# Patient Record
Sex: Female | Born: 1990 | Race: Black or African American | Hispanic: No | Smoking: Former smoker
Health system: Southern US, Community
[De-identification: ages and names within clinical notes are randomized; demographics above are authoritative.]

## PROBLEM LIST (undated history)

## (undated) ENCOUNTER — Inpatient Hospital Stay (HOSPITAL_COMMUNITY): Payer: Self-pay

## (undated) ENCOUNTER — Inpatient Hospital Stay: Payer: Self-pay

## (undated) DIAGNOSIS — O24419 Gestational diabetes mellitus in pregnancy, unspecified control: Secondary | ICD-10-CM

## (undated) DIAGNOSIS — O1490 Unspecified pre-eclampsia, unspecified trimester: Secondary | ICD-10-CM

## (undated) DIAGNOSIS — F32A Depression, unspecified: Secondary | ICD-10-CM

## (undated) DIAGNOSIS — F329 Major depressive disorder, single episode, unspecified: Secondary | ICD-10-CM

## (undated) DIAGNOSIS — J45909 Unspecified asthma, uncomplicated: Secondary | ICD-10-CM

## (undated) DIAGNOSIS — O139 Gestational [pregnancy-induced] hypertension without significant proteinuria, unspecified trimester: Secondary | ICD-10-CM

## (undated) DIAGNOSIS — Z87440 Personal history of urinary (tract) infections: Secondary | ICD-10-CM

## (undated) DIAGNOSIS — D649 Anemia, unspecified: Secondary | ICD-10-CM

## (undated) HISTORY — DX: Depression, unspecified: F32.A

## (undated) HISTORY — DX: Gestational diabetes mellitus in pregnancy, unspecified control: O24.419

## (undated) HISTORY — DX: Anemia, unspecified: D64.9

## (undated) HISTORY — DX: Unspecified pre-eclampsia, unspecified trimester: O14.90

## (undated) HISTORY — PX: NO PAST SURGERIES: SHX2092

## (undated) HISTORY — DX: Gestational (pregnancy-induced) hypertension without significant proteinuria, unspecified trimester: O13.9

## (undated) HISTORY — DX: Personal history of urinary (tract) infections: Z87.440

---
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
## 2009-05-21 ENCOUNTER — Inpatient Hospital Stay (HOSPITAL_COMMUNITY): Admission: EM | Admit: 2009-05-21 | Discharge: 2009-05-24 | Payer: Self-pay | Admitting: Emergency Medicine

## 2009-05-23 ENCOUNTER — Ambulatory Visit: Payer: Self-pay | Admitting: Internal Medicine

## 2009-06-27 ENCOUNTER — Emergency Department (HOSPITAL_COMMUNITY): Admission: EM | Admit: 2009-06-27 | Discharge: 2009-06-27 | Payer: Self-pay | Admitting: Emergency Medicine

## 2010-03-26 ENCOUNTER — Emergency Department (HOSPITAL_COMMUNITY): Admission: EM | Admit: 2010-03-26 | Discharge: 2010-03-26 | Payer: Self-pay | Admitting: Emergency Medicine

## 2010-12-06 LAB — POCT I-STAT, CHEM 8
Chloride: 111 mEq/L (ref 96–112)
Glucose, Bld: 91 mg/dL (ref 70–99)
Hemoglobin: 13.9 g/dL (ref 12.0–15.0)
Potassium: 3.5 mEq/L (ref 3.5–5.1)
Sodium: 143 mEq/L (ref 135–145)
TCO2: 20 mmol/L (ref 0–100)

## 2010-12-07 LAB — POCT I-STAT, CHEM 8
Calcium, Ion: 1.16 mmol/L (ref 1.12–1.32)
HCT: 43 % (ref 36.0–46.0)
Sodium: 141 mEq/L (ref 135–145)

## 2010-12-07 LAB — URINALYSIS, ROUTINE W REFLEX MICROSCOPIC
Bilirubin Urine: NEGATIVE
Glucose, UA: NEGATIVE mg/dL
Nitrite: NEGATIVE
Urobilinogen, UA: 1 mg/dL (ref 0.0–1.0)

## 2010-12-07 LAB — DIFFERENTIAL
Basophils Absolute: 0.2 10*3/uL — ABNORMAL HIGH (ref 0.0–0.1)
Eosinophils Absolute: 0.3 10*3/uL (ref 0.0–0.7)
Eosinophils Relative: 3 % (ref 0–5)
Lymphocytes Relative: 32 % (ref 12–46)
Lymphocytes Relative: 36 % (ref 12–46)
Monocytes Absolute: 0.4 10*3/uL (ref 0.1–1.0)
Monocytes Absolute: 0.5 10*3/uL (ref 0.1–1.0)
Monocytes Relative: 6 % (ref 3–12)
Neutro Abs: 4.3 10*3/uL (ref 1.7–7.7)
Neutrophils Relative %: 53 % (ref 43–77)
Neutrophils Relative %: 58 % (ref 43–77)

## 2010-12-07 LAB — BASIC METABOLIC PANEL
BUN: 12 mg/dL (ref 6–23)
Creatinine, Ser: 0.78 mg/dL (ref 0.4–1.2)
Creatinine, Ser: 0.84 mg/dL (ref 0.4–1.2)
GFR calc non Af Amer: >60 mL/min (ref >60)
Glucose, Bld: 97 mg/dL (ref 70–99)
Potassium: 3.9 mEq/L (ref 3.5–5.1)
Sodium: 139 mEq/L (ref 135–145)

## 2010-12-07 LAB — CBC
HCT: 35.3 % — ABNORMAL LOW (ref 36.0–46.0)
MCHC: 33.8 g/dL (ref 30.0–36.0)
MCHC: 34.1 g/dL (ref 30.0–36.0)
MCV: 85.4 fL (ref 78.0–100.0)
Platelets: 261 10*3/uL (ref 150–400)
RBC: 4.13 MIL/uL (ref 3.87–5.11)
RDW: 12.5 % (ref 11.5–15.5)
RDW: 12.6 % (ref 11.5–15.5)
WBC: 8.2 10*3/uL (ref 4.0–10.5)

## 2010-12-07 LAB — GONOCOCCUS DNA, PCR: GC Probe Amp, Genital: NEGATIVE

## 2010-12-07 LAB — GRAM STAIN: Gram Stain: NONE SEEN

## 2010-12-07 LAB — WOUND CULTURE: Culture: NO GROWTH

## 2010-12-07 LAB — URINE MICROSCOPIC-ADD ON

## 2010-12-07 LAB — CULTURE, BLOOD (ROUTINE X 2)

## 2011-06-10 LAB — POCT URINALYSIS DIP (DEVICE)
Operator id: 148111
Protein, ur: NEGATIVE

## 2011-06-10 LAB — RPR: RPR Ser Ql: NONREACTIVE

## 2011-06-10 LAB — CBC
MCHC: 34
Platelets: 307
RBC: 4.66

## 2011-06-11 LAB — POCT URINALYSIS DIP (DEVICE)
Bilirubin Urine: NEGATIVE
Glucose, UA: NEGATIVE
Glucose, UA: NEGATIVE
Ketones, ur: NEGATIVE
Nitrite: NEGATIVE
Nitrite: NEGATIVE
Operator id: 159681
Protein, ur: NEGATIVE
Specific Gravity, Urine: 1.025
Urobilinogen, UA: 0.2
Urobilinogen, UA: 0.2
pH: 6

## 2011-06-12 LAB — URINE MICROSCOPIC-ADD ON

## 2011-06-12 LAB — POCT URINALYSIS DIP (DEVICE)
Bilirubin Urine: NEGATIVE
Glucose, UA: NEGATIVE
Ketones, ur: NEGATIVE
Nitrite: NEGATIVE
Operator id: 134861
Specific Gravity, Urine: >=1.03
Urobilinogen, UA: 0.2
Urobilinogen, UA: 1
pH: 6

## 2011-06-12 LAB — CBC
HCT: 33.8 — ABNORMAL LOW
Hemoglobin: 11.5 — ABNORMAL LOW
MCV: 84.7
RDW: 13

## 2011-06-12 LAB — URINALYSIS, ROUTINE W REFLEX MICROSCOPIC
Bilirubin Urine: NEGATIVE
Ketones, ur: NEGATIVE
Nitrite: NEGATIVE
Protein, ur: NEGATIVE
Specific Gravity, Urine: 1.025
Specific Gravity, Urine: 1.025
Urobilinogen, UA: 0.2
Urobilinogen, UA: 0.2

## 2011-06-12 LAB — URINE CULTURE
Colony Count: NO GROWTH
Culture: NO GROWTH

## 2011-06-13 LAB — POCT URINALYSIS DIP (DEVICE)
Nitrite: NEGATIVE
Protein, ur: NEGATIVE
Urobilinogen, UA: 1
pH: 6.5

## 2011-06-14 LAB — GC/CHLAMYDIA PROBE AMP, GENITAL: Chlamydia, DNA Probe: NEGATIVE

## 2011-06-14 LAB — WET PREP, GENITAL: Clue Cells Wet Prep HPF POC: NONE SEEN

## 2011-06-14 LAB — DIFFERENTIAL
Basophils Absolute: 0
Eosinophils Relative: 0
Lymphocytes Relative: 11 — ABNORMAL LOW

## 2011-06-14 LAB — CBC
HCT: 33.7 — ABNORMAL LOW
Platelets: 283
RDW: 13.5

## 2011-06-14 LAB — URINALYSIS, ROUTINE W REFLEX MICROSCOPIC
Bilirubin Urine: NEGATIVE
Hgb urine dipstick: NEGATIVE
Ketones, ur: NEGATIVE
Protein, ur: NEGATIVE
Urobilinogen, UA: 0.2

## 2011-06-14 LAB — TYPE AND SCREEN
ABO/RH(D): A POS
Antibody Screen: NEGATIVE

## 2011-06-14 LAB — RUBELLA SCREEN: Rubella: 162.3 — ABNORMAL HIGH

## 2011-06-14 LAB — HEPATITIS B SURFACE ANTIGEN: Hepatitis B Surface Ag: NEGATIVE

## 2012-02-13 ENCOUNTER — Emergency Department (HOSPITAL_COMMUNITY): Payer: Self-pay

## 2012-02-13 ENCOUNTER — Emergency Department (HOSPITAL_COMMUNITY)
Admission: EM | Admit: 2012-02-13 | Discharge: 2012-02-13 | Disposition: A | Discharge status: Home / Self Care | Payer: Self-pay | Attending: Emergency Medicine | Admitting: Emergency Medicine

## 2012-02-13 ENCOUNTER — Encounter (HOSPITAL_COMMUNITY): Payer: Self-pay | Admitting: Emergency Medicine

## 2012-02-13 DIAGNOSIS — W010XXA Fall on same level from slipping, tripping and stumbling without subsequent striking against object, initial encounter: Secondary | ICD-10-CM | POA: Insufficient documentation

## 2012-02-13 DIAGNOSIS — F17200 Nicotine dependence, unspecified, uncomplicated: Secondary | ICD-10-CM | POA: Insufficient documentation

## 2012-02-13 DIAGNOSIS — Y9302 Activity, running: Secondary | ICD-10-CM | POA: Insufficient documentation

## 2012-02-13 DIAGNOSIS — S93409A Sprain of unspecified ligament of unspecified ankle, initial encounter: Secondary | ICD-10-CM | POA: Insufficient documentation

## 2012-02-13 MED ORDER — OXYCODONE-ACETAMINOPHEN 5-325 MG PO TABS
2.0000 | ORAL_TABLET | Freq: Once | ORAL | Status: AC
  Administered 2012-02-13: 2 via ORAL
  Filled 2012-02-13: qty 2

## 2012-02-13 NOTE — ED Notes (Signed)
Patient states that she fell up the stars and twisted her left ankle. The patient states that it immediately swollen. The patient denies and further complaints. Patient walks with limp

## 2012-02-13 NOTE — Discharge Instructions (Signed)
Be sure to read and understand instructions below prior to leaving the hospital. If your symptoms persist without any improvement in 1 week it is reccommended that you follow up with orthopedics listed above. Use your pain medication as prescribed and do not operate heavy machinery while on pain medication. Note that your pain medication contains acetaminophen (Tylenol) & its is not reccommended that you use additional acetaminophen (Tylenol) while taking this medication.  Ankle Sprain  An ankle sprain is an injury to the ligaments that hold the ankle joint together. Your X-ray today showed no evidence of fracture, however keep all follow-up appointments with an orthopedic specialist to have follow-up X-rays, because as we discussed fractures may not appear until 3 days after the acute injury.    TREATMENT  Rest, ice, elevation, and compression are the basic modes of treatment.    HOME CARE INSTRUCTIONS  Apply ice to the sore area for 15 to 20 minutes, 3 to 4 times per day. Do this while you are awake for the first 2 days, or as directed. This can be stopped when the swelling goes away. Put the ice in a plastic bag and place a towel between the bag of ice and your skin.  Keep your leg elevated when possible to lessen swelling.  If your caregiver recommends crutches, use them as instructed for 1 week. Then, you may walk on your ankle weight bearing as tolerated.  You may take off your ankle stabilizer at night and to take a shower or bath. Wiggle your toes in the splint several times per day if you are able.  Do not drive a vehicle on pain medication. ACTIVITY:            - Weight bearing as tolerated            - Exercises should be limited to pain free range of motion            - Can start mobilization by tracing the alphabet with your foot in the air.       SEEK MEDICAL CARE IF:  You have an increase in bruising, swelling, or pain.  Your toes feel cold.  Pain relief is not achieved with  medications.  EMERGENCY:: Your toes are numb or blue or you have severe pain.  MAKE SURE YOU:  Understand these instructions.  Will watch your condition.  Will get help right away if you are not doing well or get worse   COLD THERAPY DIRECTIONS:  Ice or gel packs can be used to reduce both pain and swelling. Ice is the most helpful within the first 24 to 48 hours after an injury or flareup from overusing a muscle or joint.  Ice is effective, has very few side effects, and is safe for most people to use.   If you expose your skin to cold temperatures for too long or without the proper protection, you can damage your skin or nerves. Watch for signs of skin damage due to cold.   HOME CARE INSTRUCTIONS  Follow these tips to use ice and cold packs safely.  Place a dry or damp towel between the ice and skin. A damp towel will cool the skin more quickly, so you may need to shorten the time that the ice is used.  For a more rapid response, add gentle compression to the ice.  Ice for no more than 10 to 20 minutes at a time. The bonier the area you are icing, the less   time it will take to get the benefits of ice.  Check your skin after 5 minutes to make sure there are no signs of a poor response to cold or skin damage.  Rest 20 minutes or more in between uses.  Once your skin is numb, you can end your treatment. You can test numbness by very lightly touching your skin. The touch should be so light that you do not see the skin dimple from the pressure of your fingertip. When using ice, most people will feel these normal sensations in this order: cold, burning, aching, and numbness.  Do not use ice on someone who cannot communicate their responses to pain, such as small children or people with dementia.   HOW TO MAKE AN ICE PACK  To make an ice pack, do one of the following:  Place crushed ice or a bag of frozen vegetables in a sealable plastic bag. Squeeze out the excess air. Place this bag inside  another plastic bag. Slide the bag into a pillowcase or place a damp towel between your skin and the bag.  Mix 3 parts water with 1 part rubbing alcohol. Freeze the mixture in a sealable plastic bag. When you remove the mixture from the freezer, it will be slushy. Squeeze out the excess air. Place this bag inside another plastic bag. Slide the bag into a pillowcase or place a damp towel between your sk 

## 2012-02-13 NOTE — ED Provider Notes (Signed)
History     CSN: [id number]  Arrival date & time 02/13/12  1753   First MD Initiated Contact with Patient 02/13/12 1934      Chief Complaint  Patient presents with  . Ankle Pain    (Consider location/radiation/quality/duration/timing/severity/associated sxs/prior treatment) HPI Comments: Patient reports that approximately 2 hours prior to arrival she was running while it was raining and slipped on mud.  She then twisted her left ankle and fell to the ground.  She is currently having pain of the medial and the lateral portion of the ankle.  No prior injury. No numbness or tingling.  She is having some swelling over the medial malleolus.  She reports significant pain with ambulation.  She has tried taking Ibuprofen for the pain, but does not feel that it helped.  The history is provided by the patient.    History reviewed. No pertinent past medical history.  History reviewed. No pertinent past surgical history.  No family history on file.  History  Substance Use Topics  . Smoking status: Current Everyday Smoker -- 1.0 packs/day    Types: Cigarettes  . Smokeless tobacco: Not on file  . Alcohol Use: Yes     occasionally    OB History    Grav Para Term Preterm Abortions TAB SAB Ect Mult Living                  Review of Systems  Constitutional: Negative for fever and chills.  Gastrointestinal: Negative for nausea and vomiting.  Musculoskeletal: Positive for joint swelling.       Pain with ambulation  Skin: Negative for color change and wound.  Neurological: Negative for numbness.    Allergies  Review of patient's allergies indicates no known allergies.  Home Medications   Current Outpatient Rx  Name Route Sig Dispense Refill  . ACETAMINOPHEN 500 MG PO TABS Oral Take 1,000 mg by mouth once.    . IBUPROFEN 200 MG PO TABS Oral Take 600 mg by mouth once.    Marland Kitchen MEDROXYPROGESTERONE ACETATE 150 MG/ML IM SUSP Intramuscular Inject 150 mg into the muscle every 3 (three)  months.      BP 118/59  Pulse 80  Temp 98.2 F (36.8 C) (Oral)  Resp 20  SpO2 100%  LMP 02/06/2012  Physical Exam  Nursing note and vitals reviewed. Constitutional: She appears well-developed and well-nourished. No distress.  HENT:  Head: Normocephalic and atraumatic.  Mouth/Throat: Oropharynx is clear and moist.  Neck: Normal range of motion. Neck supple.  Cardiovascular: Normal rate, regular rhythm, normal heart sounds and intact distal pulses.   Pulses:      Dorsalis pedis pulses are 2+ on the right side, and 2+ on the left side.  Pulmonary/Chest: Effort normal and breath sounds normal.  Musculoskeletal:       Left ankle: She exhibits decreased range of motion and swelling. She exhibits no ecchymosis, no deformity, no laceration and normal pulse. tenderness. Lateral malleolus and medial malleolus tenderness found. Achilles tendon exhibits no pain.       Pain with ambulation and bearing weight  Neurological: She is alert. No sensory deficit.  Skin: Skin is warm and dry. She is not diaphoretic. No erythema.  Psychiatric: She has a normal mood and affect.    ED Course  Procedures (including critical care time)  Labs Reviewed - No data to display Dg Ankle Complete Left  02/13/2012  *RADIOLOGY REPORT*  Clinical Data: Fall, pain  LEFT ANKLE COMPLETE - 3+ VIEW  Comparison: None.  Findings: Normal alignment without effusion.  No definite displaced fracture.  Intact malleoli, talus and calcaneus.  IMPRESSION: No acute osseous finding.  Mild soft tissue swelling.  Original Report Authenticated By: Judie Petit. Ruel Favors, M.D.     No diagnosis found.    MDM  Negative xray.  Neurovascularly intact.  Patient given ASO and crutches.          Pascal Lux Crabtree, PA-C 02/14/12 0222

## 2012-02-14 NOTE — ED Provider Notes (Signed)
Medical screening examination/treatment/procedure(s) were performed by non-physician practitioner and as supervising physician I was immediately available for consultation/collaboration.  Raeford Razor, MD 02/14/12 1245

## 2012-10-19 ENCOUNTER — Ambulatory Visit (INDEPENDENT_AMBULATORY_CARE_PROVIDER_SITE_OTHER): Payer: Self-pay

## 2012-10-19 DIAGNOSIS — Z3202 Encounter for pregnancy test, result negative: Secondary | ICD-10-CM

## 2012-10-19 DIAGNOSIS — Z3200 Encounter for pregnancy test, result unknown: Secondary | ICD-10-CM

## 2013-08-18 ENCOUNTER — Encounter (HOSPITAL_COMMUNITY): Payer: Self-pay | Admitting: Emergency Medicine

## 2013-08-18 ENCOUNTER — Emergency Department (HOSPITAL_COMMUNITY)
Admission: EM | Admit: 2013-08-18 | Discharge: 2013-08-18 | Disposition: A | Discharge status: Home / Self Care | Payer: Self-pay | Attending: Emergency Medicine | Admitting: Emergency Medicine

## 2013-08-18 ENCOUNTER — Emergency Department (HOSPITAL_COMMUNITY): Payer: Self-pay

## 2013-08-18 DIAGNOSIS — Y939 Activity, unspecified: Secondary | ICD-10-CM | POA: Insufficient documentation

## 2013-08-18 DIAGNOSIS — W010XXA Fall on same level from slipping, tripping and stumbling without subsequent striking against object, initial encounter: Secondary | ICD-10-CM | POA: Insufficient documentation

## 2013-08-18 DIAGNOSIS — S8002XA Contusion of left knee, initial encounter: Secondary | ICD-10-CM

## 2013-08-18 DIAGNOSIS — W19XXXA Unspecified fall, initial encounter: Secondary | ICD-10-CM

## 2013-08-18 DIAGNOSIS — S8000XA Contusion of unspecified knee, initial encounter: Secondary | ICD-10-CM | POA: Insufficient documentation

## 2013-08-18 DIAGNOSIS — F17200 Nicotine dependence, unspecified, uncomplicated: Secondary | ICD-10-CM | POA: Insufficient documentation

## 2013-08-18 DIAGNOSIS — Y929 Unspecified place or not applicable: Secondary | ICD-10-CM | POA: Insufficient documentation

## 2013-08-18 MED ORDER — IBUPROFEN 800 MG PO TABS: 800.0000 mg | ORAL_TABLET | Freq: Three times a day (TID) | ORAL | Status: DC | PRN

## 2013-08-18 MED ORDER — HYDROCODONE-ACETAMINOPHEN 5-325 MG PO TABS
1.0000 | ORAL_TABLET | Freq: Once | ORAL | Status: AC
  Administered 2013-08-18: 1 via ORAL
  Filled 2013-08-18: qty 1

## 2013-08-18 NOTE — ED Provider Notes (Signed)
CSN: [id number]     Arrival date & time 08/18/13  1055 History  This chart was scribed for non-physician practitioner, Trixie Dredge, PA-C working with Flint Melter, MD by Greggory Stallion, ED scribe. This patient was seen in room TR06C/TR06C and the patient's care was started at 12:34 PM.   Chief Complaint  Patient presents with  . Knee Pain   The history is provided by the patient. No language interpreter was used.   HPI Comments: Robin [name] is a 22 y.o. female who presents to the Emergency Department complaining of left knee injury that occurred 2 days ago when she slipped on liquid and landed on her knee. Denies hitting her head or LOC. Pt has sudden onset left knee pain with associated swelling that radiates into her lower leg. Rates the pain 8/10. Bearing weight worsens the pain. She has taken ibuprofen and tylenol with no relief. Denies back pain, weakness, numbness.   History reviewed. No pertinent past medical history. History reviewed. No pertinent past surgical history. History reviewed. No pertinent family history. History  Substance Use Topics  . Smoking status: Current Every Day Smoker -- 1.00 packs/day    Types: Cigarettes  . Smokeless tobacco: Not on file  . Alcohol Use: Yes     Comment: occasionally   OB History   Grav Para Term Preterm Abortions TAB SAB Ect Mult Living                 Review of Systems  Musculoskeletal: Positive for arthralgias, joint swelling and myalgias.  Neurological: Negative for weakness and numbness.  All other systems reviewed and are negative.    Allergies  Review of patient's allergies indicates no known allergies.  Home Medications   Current Outpatient Rx  Name  Route  Sig  Dispense  Refill  . acetaminophen (TYLENOL) 500 MG tablet   Oral   Take 1,000 mg by mouth once.         Marland Kitchen ibuprofen (ADVIL,MOTRIN) 200 MG tablet   Oral   Take 600 mg by mouth once.         . medroxyPROGESTERone (DEPO-PROVERA) 150 MG/ML  injection   Intramuscular   Inject 150 mg into the muscle every 3 (three) months.          BP 127/77  Pulse 95  Temp(Src) 98.5 F (36.9 C) (Oral)  Resp 20  SpO2 100%  Physical Exam  Nursing note and vitals reviewed. Constitutional: She appears well-developed and well-nourished. No distress.  HENT:  Head: Normocephalic and atraumatic.  Neck: Neck supple.  Cardiovascular: Intact distal pulses.   Pulmonary/Chest: Effort normal.  Musculoskeletal:       Left knee: She exhibits decreased range of motion. She exhibits no swelling, no erythema, normal alignment, no LCL laxity, normal meniscus and no MCL laxity. Tenderness found.       Left ankle: She exhibits decreased range of motion.       Legs: Tenderness over left patella. Decreased active ROM without crepitus. Medial malleolus tender.  Neurological: She is alert.  Skin: She is not diaphoretic.    ED Course  Procedures (including critical care time)  DIAGNOSTIC STUDIES: Oxygen Saturation is 100% on RA, normal by my interpretation.    COORDINATION OF CARE: 12:37 PM-Discussed treatment plan which includes xrays with pt at bedside and pt agreed to plan.   Labs Review Labs Reviewed - No data to display Imaging Review Dg Ankle Complete Left  08/18/2013   CLINICAL DATA:  Pain  EXAM: LEFT ANKLE COMPLETE - 3+ VIEW  COMPARISON:  02/13/2012  FINDINGS: Three views of left ankle submitted. No acute fracture or subluxation. Ankle mortise is preserved.  IMPRESSION: Negative.   Electronically Signed   By: Natasha Mead M.D.   On: 08/18/2013 13:21   Dg Knee Complete 4 Views Left  08/18/2013   CLINICAL DATA:  Traumatic injury and pain  EXAM: LEFT KNEE - COMPLETE 4+ VIEW  COMPARISON:  None.  FINDINGS: There is no evidence of fracture, dislocation, or joint effusion. There is no evidence of arthropathy or other focal bone abnormality. Soft tissues are unremarkable.  IMPRESSION: No acute abnormality is noted.   Electronically Signed   By: Alcide Clever M.D.   On: 08/18/2013 13:20    EKG Interpretation   None       MDM   1. Knee contusion, left, initial encounter   2. Fall, initial encounter    Pt with fall onto left knee two days ago.  Xray negative.  Neurovascularly intact.  No other injuries.  Exam unremarkable.  Discussed result, findings, treatment, and follow up  with patient.  Pt given return precautions.  Pt verbalizes understanding and agrees with plan.      I personally performed the services described in this documentation, which was scribed in my presence. The recorded information has been reviewed and is accurate.   Trixie Dredge, PA-C 08/18/13 1529

## 2013-08-18 NOTE — ED Provider Notes (Signed)
Medical screening examination/treatment/procedure(s) were performed by non-physician practitioner and as supervising physician I was immediately available for consultation/collaboration.  Flint Melter, MD 08/18/13 [phone]

## 2013-08-18 NOTE — ED Notes (Signed)
Pt c/o left knee pain and swelling since slipping and falling on knee 2 days ago; CMS intact

## 2013-12-12 ENCOUNTER — Emergency Department (HOSPITAL_COMMUNITY)
Admission: EM | Admit: 2013-12-12 | Discharge: 2013-12-12 | Disposition: A | Discharge status: Home / Self Care | Payer: Self-pay | Attending: Emergency Medicine | Admitting: Emergency Medicine

## 2013-12-12 ENCOUNTER — Encounter (HOSPITAL_COMMUNITY): Payer: Self-pay | Admitting: Emergency Medicine

## 2013-12-12 DIAGNOSIS — F411 Generalized anxiety disorder: Secondary | ICD-10-CM | POA: Insufficient documentation

## 2013-12-12 DIAGNOSIS — R0789 Other chest pain: Secondary | ICD-10-CM | POA: Insufficient documentation

## 2013-12-12 DIAGNOSIS — J45909 Unspecified asthma, uncomplicated: Secondary | ICD-10-CM | POA: Insufficient documentation

## 2013-12-12 DIAGNOSIS — F17200 Nicotine dependence, unspecified, uncomplicated: Secondary | ICD-10-CM | POA: Insufficient documentation

## 2013-12-12 DIAGNOSIS — Z79899 Other long term (current) drug therapy: Secondary | ICD-10-CM | POA: Insufficient documentation

## 2013-12-12 DIAGNOSIS — F419 Anxiety disorder, unspecified: Secondary | ICD-10-CM

## 2013-12-12 HISTORY — DX: Unspecified asthma, uncomplicated: J45.909

## 2013-12-12 MED ORDER — LORAZEPAM 1 MG PO TABS
1.0000 mg | ORAL_TABLET | Freq: Once | ORAL | Status: AC
  Administered 2013-12-12: 1 mg via ORAL
  Filled 2013-12-12: qty 1

## 2013-12-12 MED ORDER — LORAZEPAM 1 MG PO TABS: 1.0000 mg | ORAL_TABLET | Freq: Three times a day (TID) | ORAL | Status: DC | PRN

## 2013-12-12 MED ORDER — ALBUTEROL SULFATE HFA 108 (90 BASE) MCG/ACT IN AERS
1.0000 | INHALATION_SPRAY | Freq: Once | RESPIRATORY_TRACT | Status: AC
  Administered 2013-12-12: 1 via RESPIRATORY_TRACT
  Filled 2013-12-12: qty 6.7

## 2013-12-12 NOTE — ED Provider Notes (Signed)
CSN: [id number]     Arrival date & time 12/12/13  1232 History   First MD Initiated Contact with Patient 12/12/13 1240     Chief Complaint  Patient presents with  . Anxiety     (Consider location/radiation/quality/duration/timing/severity/associated sxs/prior Treatment) HPI Comments: Patient is a 23 year old female with a past medical history of asthma who presents with an episode of shortness of breath, chest tightness and anxiety. Symptoms started suddenly when she was at work. Symptoms improved upon arrival to the ED via EMS but still mildly continued. Patient reports increasing life stressors lately. No alleviating factors. Patient does not take exogenous estrogen, report any recent travel/surgery/immobilization. No associated symptoms.    Past Medical History  Diagnosis Date  . Asthma    History reviewed. No pertinent past surgical history. No family history on file. History  Substance Use Topics  . Smoking status: Current Every Day Smoker -- 1.00 packs/day    Types: Cigarettes  . Smokeless tobacco: Not on file  . Alcohol Use: Yes     Comment: occasionally   OB History   Grav Para Term Preterm Abortions TAB SAB Ect Mult Living                 Review of Systems  Constitutional: Negative for fever, chills and fatigue.  HENT: Negative for trouble swallowing.   Eyes: Negative for visual disturbance.  Respiratory: Positive for chest tightness and shortness of breath.   Cardiovascular: Negative for chest pain and palpitations.  Gastrointestinal: Negative for nausea, vomiting, abdominal pain and diarrhea.  Genitourinary: Negative for dysuria and difficulty urinating.  Musculoskeletal: Negative for arthralgias and neck pain.  Skin: Negative for color change.  Neurological: Negative for dizziness and weakness.  Psychiatric/Behavioral: Negative for dysphoric mood. The patient is nervous/anxious.       Allergies  Review of patient's allergies indicates no known  allergies.  Home Medications   Current Outpatient Rx  Name  Route  Sig  Dispense  Refill  . albuterol (PROVENTIL HFA;VENTOLIN HFA) 108 (90 BASE) MCG/ACT inhaler   Inhalation   Inhale 2 puffs into the lungs every 4 (four) hours as needed for wheezing or shortness of breath.         Marland Kitchen. ibuprofen (ADVIL,MOTRIN) 200 MG tablet   Oral   Take 800 mg by mouth every 4 (four) hours as needed for moderate pain.           BP 119/63  Pulse 81  Temp(Src) 98.3 F (36.8 C) (Oral)  Resp 20  SpO2 100% Physical Exam  Nursing note and vitals reviewed. Constitutional: She appears well-developed and well-nourished. No distress.  HENT:  Head: Normocephalic and atraumatic.  Eyes: Conjunctivae and EOM are normal.  Neck: Normal range of motion.  Cardiovascular: Normal rate and regular rhythm.  Exam reveals no gallop and no friction rub.   No murmur heard. Pulmonary/Chest: Effort normal and breath sounds normal. She has no wheezes. She has no rales. She exhibits no tenderness.  Abdominal: Soft. She exhibits no distension. There is no tenderness. There is no rebound.  Musculoskeletal: Normal range of motion.  No lower extremity edema or calf tenderness bilaterally.   Neurological: She is alert.  Speech is goal-oriented. Moves limbs without ataxia.   Skin: Skin is warm and dry.  Psychiatric:  Patient is anxious.     ED Course  Procedures (including critical care time) Labs Review Labs Reviewed - No data to display Imaging Review No results found.   EKG  Interpretation   Date/Time:  Sunday December 12 2013 12:36:48 EDT Ventricular Rate:  84 PR Interval:  136 QRS Duration: 84 QT Interval:  362 QTC Calculation: 428 R Axis:   69 Text Interpretation:  Sinus rhythm Confirmed by WARD,  DO, KRISTEN ([id number])  on 12/12/2013 1:03:53 PM      MDM   Final diagnoses:  Anxiety    12:53 PM Patient is PERC negative. Vitals stable and patient afebrile. Patient likely having anxiety attack due to  life stress. Patient will have EKG and PO ativan here.   1:52 PM Patient feeling much better after PO ativan. Patient will be discharged with short course of ativan. Patient will be discharged with resource guide for recommended PCP follow up. Vitals stable and patient afebrile.   Robin Beck, PA-C 12/12/13 1358

## 2013-12-12 NOTE — ED Notes (Addendum)
EMS reports pt was at work, started to feel overwhelmed, started to breath fast then chest started hurting, pt thought she was having an asthma attack. When instructed to slow breath, symptoms improve

## 2013-12-12 NOTE — ED Notes (Signed)
Bed: WA17 Expected date: 12/12/13 Expected time: 12:26 PM Means of arrival: Ambulance Comments: ? Anxiety

## 2013-12-12 NOTE — ED Notes (Signed)
Bed: WA19 Expected date:  Expected time:  Means of arrival:  Comments: 

## 2013-12-12 NOTE — ED Notes (Signed)
Pt talking in complete sentences to visitor.

## 2013-12-12 NOTE — ED Provider Notes (Signed)
Medical screening examination/treatment/procedure(s) were performed by non-physician practitioner and as supervising physician I was immediately available for consultation/collaboration.   EKG Interpretation   Date/Time:  Sunday December 12 2013 12:36:48 EDT Ventricular Rate:  84 PR Interval:  136 QRS Duration: 84 QT Interval:  362 QTC Calculation: 428 R Axis:   69 Text Interpretation:  Sinus rhythm Confirmed by Demerius Podolak,  DO, Jameica Couts ([id number])  on 12/12/2013 1:03:53 PM        Robin MawKristen N Barrie Wale, DO 12/12/13 1730

## 2013-12-12 NOTE — Discharge Instructions (Signed)
Take ativan as needed for anxiety. Follow up with a primary care provider form the resource guide below.    Emergency Department Resource Guide 1) Find a Doctor and Pay Out of Pocket Although you won't have to find out who is covered by your insurance plan, it is a good idea to ask around and get recommendations. You will then need to call the office and see if the doctor you have chosen will accept you as a new patient and what types of options they offer for patients who are self-pay. Some doctors offer discounts or will set up payment plans for their patients who do not have insurance, but you will need to ask so you aren't surprised when you get to your appointment.  2) Contact Your Local Health Department Not all health departments have doctors that can see patients for sick visits, but many do, so it is worth a call to see if yours does. If you don't know where your local health department is, you can check in your phone book. The CDC also has a tool to help you locate your state's health department, and many state websites also have listings of all of their local health departments.  3) Find a Walk-in Clinic If your illness is not likely to be very severe or complicated, you may want to try a walk in clinic. These are popping up all over the country in pharmacies, drugstores, and shopping centers. They're usually staffed by nurse practitioners or physician assistants that have been trained to treat common illnesses and complaints. They're usually fairly quick and inexpensive. However, if you have serious medical issues or chronic medical problems, these are probably not your best option.  No Primary Care Doctor: - Call Health Connect at  [phone] - they can help you locate a primary care doctor that  accepts your insurance, provides certain services, etc. - Physician Referral Service- [phone]  Chronic Pain Problems: Organization         Address  Phone   Notes  [facility]  [phone] Patients need to be referred by their primary care doctor.   Medication Assistance: Organization         Address  Phone   Notes  [facility] [address] [place], [place] [address] [phone] --Must be a resident of [place] -- Must have NO insurance coverage whatsoever (no Medicaid/ Medicare, etc.) -- The pt. MUST have a primary care doctor that directs their care regularly and follows them in the community   MedAssist  [phone]   [employer]  [phone]    Agencies that provide inexpensive medical care: Organization         Address  Phone   Notes  [facility]  [phone]   [facility]    [phone]   [facility] [address] [place], [place] [address] [phone]   [facility] [address], [place] [phone]   [facility]anned Parenthood    [phone]   [facility]    [phone]   [facility]  [address], [place] Phone:  [phone], Fax:  [phone] Hours of Operation:  9 am - 6 pm, M-F.  Also accepts Medicaid/Medicare and self-pay.  [facility]  [address], [place] Phone: [phone], Fax: [phone].  Hours of Operation:  8:30 am - 5:30 pm, M-F.  Also accepts Medicaid and self-pay.  [facility] [place] [address], Home Phone: [phone]   [place], [place], [place] [phone], Ext. [phone] Mondays & Thursdays: 7-9 AM.  First 15 patients are seen on a first come, first serve basis.    [place] Providers:  Organization         Address  Phone   Notes  [facility] [address], [place] [phone] Also accepts self-pay patients.  [facility] [address], [employer]  [phone]   [place], [address], [place] [phone]   [facility] [address] [place] [phone]   Lucianne Lei [address], [place]   [phone] Only accepts [place] Access [place] patients after they have their name applied to their card.   Self-Pay (no insurance) in [facility]:  Organization         Address  Phone   Notes  Sickle Cell Patients, [facility] [place] [phone]   [facility] [place] [phone]   [facility] [place]  [place], [place],  [phone]   [facility]/Dr. Osei-Bonsu  [address] [place] or [address], [place] [phone] Phone number for both [place] and [place] locations is the same.  [facility] [address], [place] [phone]   [facility] [address] [place] or [address] [phone]   [facility] [address] [place] [phone], phone; [phone], fax Sees patients 1st and 3rd Saturday of every month.  Must not qualify for public or private insurance (i.e. Medicaid, Medicare, [place] Health Choice, Veterans' Benefits)  Household income should be no more than 200% of the poverty level The clinic cannot treat you if you are pregnant or think you are pregnant  Sexually transmitted diseases are not treated at the clinic.    Dental Care: Organization         Address  Phone  Notes  [facility] [phone] Accepts children up to age 74 who are enrolled in [place] or [place]; pregnant women with a Medicaid card; and children who have applied for Medicaid or [place]  Health Choice, but were declined, whose parents can pay a reduced fee at time of service.  [facility] of [facility]  [address], [place] [phone] Accepts children up to age 16 who are enrolled in [place] or [place]; pregnant women with a Medicaid card; and children who have applied for Medicaid or [place] Health Choice, but were declined, whose parents can pay a reduced fee at time of service.  [facility] PRO[facility]  [place] [phone] Patients are seen by appointment only. Walk-ins are not accepted. [place] will see patients 18 years of age and older. Monday - Tuesday (8am-5pm) Most Wednesdays (8:30-5pm) $30 per visit, cash only  [facility] Adult [employer] PROGRAM  [address], [facility] [phone] Patients  are seen by appointment only. Walk-ins are not accepted. [place] will see patients 46 years of age and older. One Wednesday Evening (Monthly: Volunteer Based).  $30 per visit, cash only  [place]  [phone] for adults; Children under age 68, call Graduate Pediatric Dentistry at [phone]. Children aged 19-14, please call [phone] to request a pediatric application.  Dental services are provided in all areas of dental care including fillings, crowns and bridges, complete and partial dentures, implants, gum treatment, root canals, and extractions. Preventive care is also provided. Treatment is provided to both adults and children. Patients are selected via a lottery and there is often a waiting list.   [facility] [address], [place]  [phone] [email or web address]   [facility] [address] [place], [place] [phone], Ext. [phone] Second and Fourth Thursday of each month, opens at 6:30 AM; Clinic ends at 9 AM.  Patients are seen on a first-come first-served basis, and a limited number are seen during  each clinic.   [facility]  [address] Hillard Danker [place], [place] [phone]   Eligibility Requirements You must have lived in [place], [place], or [place] counties for at least the last three months.   You cannot be eligible for state or federal sponsored [employer], including [employer], [place], or [employer].   You generally cannot be eligible for healthcare insurance through your employer.    How to apply: Eligibility screenings are held every Tuesday and Wednesday afternoon from 1:00 pm until 4:00 pm. You do not need an appointment for the interview!  [facility] [address] [place], [place]   [place]  [place] Department  [place]  [phone]    Behavioral Health Resources in the Community: Intensive Outpatient Programs Organization         Address  Phone  Notes  [place] Hurricane. [address] [place], [place] [phone]   [facility]patient [address] [place], [place]   ADS: [facility] Drug S[facility] [address] [place], [place]   [place] [address],  [place], [place] or [phone]   Substance Abuse Resources Organization         Address  Phone  Notes  Alcohol and Drug Services  [phone]   [place]  [phone]   The [place]   Chinita Pester  [phone]   Residential & Outpatient Substance Abuse Program  [phone]   Psychological Services Organization         Address  Phone  Notes  [facility]  [place]  [phone]   [place] [address] [place] or [phone]    Mobile Crisis Teams Organization         Address  Phone  Notes  [facility]rapeutic Alternatives, Mobile  Crisis Care Unit  [phone]   [facility]  [address] [place], [place]   [place] Levels [address], [place] [phone]    Self-Help/Support Groups Organization         Address  Phone             Notes  [place]. of [place] - variety of support groups  [place] Call  for more information  Narcotics Anonymous (NA), [facility] [address], [place]  2 meetings at this location   Residential Facilities manager         Address  Phone  Notes  [facility] [place],    Lead  [phone]   [facility]  [address], [place] [phone], [place], [place]   [place], [place] [phone] Admissions: 8am-3pm M-F  Incentives Substance [place] [address],    [place], [place] [phone]   [facility] [address] [place], [place], [place]   [facility] [address]  [place], [place]   [facility] Programs - Intensive Outpatient [address] Kristeen [address], [place], [place]   [facility] [place].,  [place], [place] [phone] or [phone]   [facility] (RTS) [address] [place], [place] Accepts Medicaid  [facility] [address]  [place] [phone] Substance Abuse/Addiction Treatment   [facility] Organization         Address  Phone  Notes  [facility]  [phone]   Domenic Schwab, PhD [address] [place], [place]   [phone] or [phone]   [place], [place] [phone]   [facility] [address], [place], [place] [phone] Insurance/Medicaid/sponsorship through [facility] and Families [address]  [place]                                    [place], [place] [phone] [place] [address] [place] [address] [employer], [place] [phone]    Dr. Adele Schilder  [phone]   [facility] Dept. 1) [address] [place] 2) [place] 3)  [place] [address], [place] [phone]  [phone]   [place] [phone] or [phone] (After Hours)

## 2014-01-05 ENCOUNTER — Encounter (HOSPITAL_COMMUNITY): Payer: Self-pay | Admitting: Emergency Medicine

## 2014-01-05 ENCOUNTER — Emergency Department (HOSPITAL_COMMUNITY)
Admission: EM | Admit: 2014-01-05 | Discharge: 2014-01-05 | Disposition: A | Discharge status: Home / Self Care | Payer: Self-pay | Attending: Emergency Medicine | Admitting: Emergency Medicine

## 2014-01-05 DIAGNOSIS — H109 Unspecified conjunctivitis: Secondary | ICD-10-CM | POA: Insufficient documentation

## 2014-01-05 DIAGNOSIS — F17200 Nicotine dependence, unspecified, uncomplicated: Secondary | ICD-10-CM | POA: Insufficient documentation

## 2014-01-05 DIAGNOSIS — Z79899 Other long term (current) drug therapy: Secondary | ICD-10-CM | POA: Insufficient documentation

## 2014-01-05 DIAGNOSIS — J45909 Unspecified asthma, uncomplicated: Secondary | ICD-10-CM | POA: Insufficient documentation

## 2014-01-05 MED ORDER — FLUORESCEIN SODIUM 1 MG OP STRP
1.0000 | ORAL_STRIP | Freq: Once | OPHTHALMIC | Status: AC
  Administered 2014-01-05: 1 via OPHTHALMIC
  Filled 2014-01-05: qty 1

## 2014-01-05 MED ORDER — POLYMYXIN B-TRIMETHOPRIM 10000-0.1 UNIT/ML-% OP SOLN
1.0000 [drp] | OPHTHALMIC | Status: DC
  Administered 2014-01-05: 1 [drp] via OPHTHALMIC
  Filled 2014-01-05 (×2): qty 10

## 2014-01-05 MED ORDER — TETRACAINE HCL 0.5 % OP SOLN
1.0000 [drp] | Freq: Once | OPHTHALMIC | Status: AC
  Administered 2014-01-05: 1 [drp] via OPHTHALMIC
  Filled 2014-01-05: qty 2

## 2014-01-05 NOTE — Discharge Instructions (Signed)
Please see the eye doctor in 2 days if symptoms do not improve.  Please use eyedrops every 4 hours.  Conjunctivitis Conjunctivitis is commonly called "pink eye." Conjunctivitis can be caused by bacterial or viral infection, allergies, or injuries. There is usually redness of the lining of the eye, itching, discomfort, and sometimes discharge. There may be deposits of matter along the eyelids. A viral infection usually causes a watery discharge, while a bacterial infection causes a yellowish, thick discharge. Pink eye is very contagious and spreads by direct contact. You may be given antibiotic eyedrops as part of your treatment. Before using your eye medicine, remove all drainage from the eye by washing gently with warm water and cotton balls. Continue to use the medication until you have awakened 2 mornings in a row without discharge from the eye. Do not rub your eye. This increases the irritation and helps spread infection. Use separate towels from other household members. Wash your hands with soap and water before and after touching your eyes. Use cold compresses to reduce pain and sunglasses to relieve irritation from light. Do not wear contact lenses or wear eye makeup until the infection is gone. SEEK MEDICAL CARE IF:   Your symptoms are not better after 3 days of treatment.  You have increased pain or trouble seeing.  The outer eyelids become very red or swollen. Document Released: 09/26/2004 Document Revised: 11/11/2011 Document Reviewed: 08/19/2005 [facility] Patient Information 2014 [place], [place].

## 2014-01-05 NOTE — ED Provider Notes (Signed)
CSN: [id number]     Arrival date & time 01/05/14  1409 History   This chart was scribed for non-physician practitioner, Roxy Horsemanobert Larcenia Holaday , working with Audree CamelScott T [place]ston, MD, by Tana ConchStephen Methvin ED Scribe. This patient was seen in WTR5/WTR5 and the patient's care was started at 2:38 PM    Chief Complaint  Patient presents with  . Eye Pain     The history is provided by the patient. No language interpreter was used.    HPI Comments: Robin [name] is a 23 y.o. female who presents to the Emergency Department with a chief complaint of burning right eye pain that began this morning when she woke up. She reports that there was a "film" on her eye this morning. Pt also reports that she cannot see out of the effected eye as well and that she is sensitive to light. She denies any injury. She works at [employer] and reports that her eye was fine when she went to bed last night.  Past Medical History  Diagnosis Date  . Asthma    History reviewed. No pertinent past surgical history. History reviewed. No pertinent family history. History  Substance Use Topics  . Smoking status: Current Every Day Smoker -- 1.00 packs/day    Types: Cigarettes  . Smokeless tobacco: Not on file  . Alcohol Use: Yes     Comment: occasionally   OB History   Grav Para Term Preterm Abortions TAB SAB Ect Mult Living                 Review of Systems  Constitutional: Negative for activity change.  HENT: Negative for congestion, sinus pressure, sneezing and sore throat.   Eyes: Positive for pain, discharge, redness and itching.  Psychiatric/Behavioral: Negative for confusion.      Allergies  Review of patient's allergies indicates no known allergies.  Home Medications   Prior to Admission medications   Medication Sig Start Date End Date Taking? Authorizing Provider  albuterol (PROVENTIL HFA;VENTOLIN HFA) 108 (90 BASE) MCG/ACT inhaler Inhale 2 puffs into the lungs every 4 (four) hours as needed for wheezing or  shortness of breath.    Historical Provider, MD  ibuprofen (ADVIL,MOTRIN) 200 MG tablet Take 800 mg by mouth every 4 (four) hours as needed for moderate pain.     Historical Provider, MD  LORazepam (ATIVAN) 1 MG tablet Take 1 tablet (1 mg total) by mouth 3 (three) times daily as needed for anxiety. 12/12/13   Kaitlyn Szekalski, PA-C   BP 127/68  Pulse 89  Temp(Src) 98.4 F (36.9 C) (Oral)  Resp 18  SpO2 100% Physical Exam  Nursing note and vitals reviewed. Constitutional: She is oriented to person, place, and time. She appears well-developed and well-nourished.  HENT:  Head: Normocephalic and atraumatic.  Eyes: Conjunctivae and EOM are normal. No scleral icterus.  Slit lamp exam:      The right eye shows no corneal abrasion, no corneal flare, no corneal ulcer, no foreign body, no hyphema, no hypopyon and no fluorescein uptake.  Mild conjunctival inflammation with clear watery discharge.   Neck: Normal range of motion. Neck supple. No thyromegaly present.  Cardiovascular: Normal rate and regular rhythm.  Exam reveals no gallop and no friction rub.   No murmur heard. Pulmonary/Chest: Effort normal. No stridor. She has no wheezes. She has no rales. She exhibits no tenderness.  Abdominal: She exhibits no distension. There is no tenderness. There is no rebound.  Musculoskeletal: Normal range of motion. She  exhibits no edema.  Lymphadenopathy:    She has no cervical adenopathy.  Neurological: She is alert and oriented to person, place, and time. She exhibits normal muscle tone. Coordination normal.  Skin: No rash noted. No erythema.  Psychiatric: She has a normal mood and affect. Her behavior is normal.    ED Course  Procedures (including critical care time)  DIAGNOSTIC STUDIES: Oxygen Saturation is 100% on RA, normal by my interpretation.    COORDINATION OF CARE:     Labs Review Labs Reviewed - No data to display  Imaging Review No results found.   EKG  Interpretation None      MDM   Final diagnoses:  Conjunctivitis    Patient with probable allergic conjunctivitis.  DC to home with ophthalmology follow-up in 2 days for new or worsening symptoms.    I personally performed the services described in this documentation, which was scribed in my presence. The recorded information has been reviewed and is accurate.     Roxy Horsemanobert Lyly Canizales, PA-C 01/05/14 2022

## 2014-01-05 NOTE — ED Notes (Signed)
Per pt, woke up today with drainage in right eye.  Painful.  States has been draining all day.  Does not recall injury.

## 2014-01-10 NOTE — ED Provider Notes (Signed)
Medical screening examination/treatment/procedure(s) were performed by non-physician practitioner and as supervising physician I was immediately available for consultation/collaboration.   EKG Interpretation None        Audree CamelScott T Rochell Mabie, MD 01/10/14 [phone]

## 2015-06-03 DIAGNOSIS — O039 Complete or unspecified spontaneous abortion without complication: Secondary | ICD-10-CM

## 2015-06-03 HISTORY — DX: Complete or unspecified spontaneous abortion without complication: O03.9

## 2015-07-02 ENCOUNTER — Encounter (HOSPITAL_COMMUNITY): Payer: Self-pay | Admitting: *Deleted

## 2015-07-02 ENCOUNTER — Inpatient Hospital Stay (HOSPITAL_COMMUNITY)
Admission: AD | Admit: 2015-07-02 | Discharge: 2015-07-02 | Disposition: A | Discharge status: Home / Self Care | Payer: Self-pay | Source: Ambulatory Visit | Attending: Family Medicine | Admitting: Family Medicine

## 2015-07-02 ENCOUNTER — Inpatient Hospital Stay (HOSPITAL_COMMUNITY): Payer: Self-pay

## 2015-07-02 DIAGNOSIS — F17210 Nicotine dependence, cigarettes, uncomplicated: Secondary | ICD-10-CM | POA: Insufficient documentation

## 2015-07-02 DIAGNOSIS — O208 Other hemorrhage in early pregnancy: Secondary | ICD-10-CM | POA: Insufficient documentation

## 2015-07-02 DIAGNOSIS — Z3491 Encounter for supervision of normal pregnancy, unspecified, first trimester: Secondary | ICD-10-CM

## 2015-07-02 DIAGNOSIS — Z3A08 8 weeks gestation of pregnancy: Secondary | ICD-10-CM | POA: Insufficient documentation

## 2015-07-02 DIAGNOSIS — O9989 Other specified diseases and conditions complicating pregnancy, childbirth and the puerperium: Secondary | ICD-10-CM

## 2015-07-02 DIAGNOSIS — R109 Unspecified abdominal pain: Secondary | ICD-10-CM

## 2015-07-02 DIAGNOSIS — O26899 Other specified pregnancy related conditions, unspecified trimester: Secondary | ICD-10-CM

## 2015-07-02 DIAGNOSIS — O99331 Smoking (tobacco) complicating pregnancy, first trimester: Secondary | ICD-10-CM | POA: Insufficient documentation

## 2015-07-02 LAB — URINALYSIS, ROUTINE W REFLEX MICROSCOPIC
BILIRUBIN URINE: NEGATIVE
Glucose, UA: NEGATIVE mg/dL
Ketones, ur: 15 mg/dL — AB
Nitrite: NEGATIVE
PH: 5.5 (ref 5.0–8.0)
Protein, ur: NEGATIVE mg/dL
Specific Gravity, Urine: >1.03 — ABNORMAL HIGH (ref 1.005–1.030)
Urobilinogen, UA: 0.2 mg/dL (ref 0.0–1.0)

## 2015-07-02 LAB — HCG, QUANTITATIVE, PREGNANCY: hCG, Beta Chain, Quant, S: 1948 m[IU]/mL — ABNORMAL HIGH (ref <5)

## 2015-07-02 LAB — CBC
HCT: 36.6 % (ref 36.0–46.0)
Hemoglobin: 12.1 g/dL (ref 12.0–15.0)
MCH: 27.7 pg (ref 26.0–34.0)
MCHC: 33.1 g/dL (ref 30.0–36.0)
MCV: 83.8 fL (ref 78.0–100.0)
Platelets: 295 10*3/uL (ref 150–400)
RBC: 4.37 MIL/uL (ref 3.87–5.11)
RDW: 13.3 % (ref 11.5–15.5)
WBC: 10.4 10*3/uL (ref 4.0–10.5)

## 2015-07-02 LAB — POCT PREGNANCY, URINE: Preg Test, Ur: POSITIVE — AB

## 2015-07-02 LAB — URINE MICROSCOPIC-ADD ON

## 2015-07-02 LAB — WET PREP, GENITAL
Clue Cells Wet Prep HPF POC: NONE SEEN
TRICH WET PREP: NONE SEEN
Yeast Wet Prep HPF POC: NONE SEEN

## 2015-07-02 LAB — ABO/RH: ABO/RH(D): A POS

## 2015-07-02 NOTE — MAU Provider Note (Signed)
History     CSN: [id number]  Arrival date and time: 07/02/15 1706   First Provider Initiated Contact with Patient 07/02/15 1745      Chief Complaint  Patient presents with  . Possible Pregnancy  . Nausea  . Emesis   HPI Robin [name] 24 y.o. [redacted]w[redacted]d  Has had pain in the sides of abdomen bilaterally all day.  Pain has not resolved.  Also had vomiting early this morning.  LMP end of August for 2 days - has history of irregular menses.  Denies having any vaginal bleeding.  Did not eat after vomiting today.  Had no appetite. No other problems voiced.  OB History    Gravida Para Term Preterm AB TAB SAB Ectopic Multiple Living   Past Medical History  Diagnosis Date  . Asthma     History reviewed. No pertinent past surgical history.  History reviewed. No pertinent family history.  Social History  Substance Use Topics  . Smoking status: Current Every Day Smoker -- 1.00 packs/day    Types: Cigarettes  . Smokeless tobacco: None  . Alcohol Use: Yes     Comment: occasionally    Allergies: No Known Allergies  Prescriptions prior to admission  Medication Sig Dispense Refill Last Dose  . acetaminophen (TYLENOL) 500 MG tablet Take 1,000 mg by mouth every 6 (six) hours as needed for mild pain.   Past Week at Unknown time  . albuterol (PROVENTIL HFA;VENTOLIN HFA) 108 (90 BASE) MCG/ACT inhaler Inhale 2 puffs into the lungs every 4 (four) hours as needed for wheezing or shortness of breath.   rescue  . ibuprofen (ADVIL,MOTRIN) 200 MG tablet Take 800 mg by mouth every 4 (four) hours as needed for moderate pain.    Past Month at Unknown time    Review of Systems  Constitutional: Negative for fever.  Gastrointestinal: Positive for nausea, vomiting and abdominal pain.  Genitourinary:       No vaginal discharge. No vaginal bleeding. No dysuria.   Physical Exam   Blood pressure 122/81, pulse 107, temperature 98.6 F (37 C), resp. rate 18, height  (1.575  m), weight 289 lb (131.09 kg), last menstrual period 05/03/2015.  Physical Exam  Nursing note and vitals reviewed. Constitutional: She is oriented to person, place, and time. She appears well-developed and well-nourished.  obese  HENT:  Head: Normocephalic.  Eyes: EOM are normal.  Neck: Neck supple.  GI: Soft. There is no tenderness. There is no rebound and no guarding.  Genitourinary:  Speculum exam: Vagina - Mod amount of creamy discharge, no odor Cervix - No contact bleeding Bimanual exam: Cervix closed and thick Uterus unable to size due to habitus Adnexa non tender, no masses bilaterally GC/Chlam, wet prep done Chaperone present for exam.  Musculoskeletal: Normal range of motion.  Neurological: She is alert and oriented to person, place, and time.  Skin: Skin is warm and dry.  Psychiatric: She has a normal mood and affect.    MAU Course  Procedures Results for orders placed or performed during the hospital encounter of 07/02/15 (from the past 24 hour(s))  Urinalysis, Routine w reflex microscopic (not at [facility])     Status: Abnormal   Collection Time: 07/02/15  5:20 PM  Result Value Ref Range   Color, Urine YELLOW YELLOW   APPearance CLEAR CLEAR   Specific Gravity, Urine >1.030 (H) 1.005 - 1.030   pH 5.5  5.0 - 8.0   Glucose, UA NEGATIVE NEGATIVE mg/dL   Hgb urine dipstick SMALL (A) NEGATIVE   Bilirubin Urine NEGATIVE NEGATIVE   Ketones, ur 15 (A) NEGATIVE mg/dL   Protein, ur NEGATIVE NEGATIVE mg/dL   Urobilinogen, UA 0.2 0.0 - 1.0 mg/dL   Nitrite NEGATIVE NEGATIVE   Leukocytes, UA SMALL (A) NEGATIVE  Urine microscopic-add on     Status: Abnormal   Collection Time: 07/02/15  5:20 PM  Result Value Ref Range   Squamous Epithelial / LPF MANY (A) RARE   WBC, UA 7-10 <3 WBC/hpf   RBC / HPF 0-2 <3 RBC/hpf   Urine-Other MUCOUS PRESENT   Pregnancy, urine POC     Status: Abnormal   Collection Time: 07/02/15  5:27 PM  Result Value Ref Range   Preg Test, Ur POSITIVE (A)  NEGATIVE  CBC     Status: None   Collection Time: 07/02/15  5:55 PM  Result Value Ref Range   WBC 10.4 4.0 - 10.5 K/uL   RBC 4.37 3.87 - 5.11 MIL/uL   Hemoglobin 12.1 12.0 - 15.0 g/dL   HCT 59.536.6 63.836.0 - 75.646.0 %   MCV 83.8 78.0 - 100.0 fL   MCH 27.7 26.0 - 34.0 pg   MCHC 33.1 30.0 - 36.0 g/dL   RDW 43.313.3 29.511.5 - 18.815.5 %   Platelets 295 150 - 400 K/uL  hCG, quantitative, pregnancy     Status: Abnormal   Collection Time: 07/02/15  5:55 PM  Result Value Ref Range   hCG, Beta Chain, Quant, S 1948 (H) <5 mIU/mL  ABO/Rh     Status: None   Collection Time: 07/02/15  5:55 PM  Result Value Ref Range   ABO/RH(D) A POS    TECHNIQUE: Both transabdominal and transvaginal ultrasound examinations were performed for complete evaluation of the gestation as well as the maternal uterus, adnexal regions, and pelvic cul-de-sac. Transvaginal technique was performed to assess early pregnancy.  COMPARISON: None.  FINDINGS: Intrauterine gestational sac: Visualized/normal in shape.  Yolk sac: Present  Embryo: Not seen  Cardiac Activity: Not seen  MSD: 12 mm 6 w 0 d  Maternal uterus/adnexae: Possible small subchorionic hemorrhage. Ovaries are normal but difficult to visualize in detail. No free fluid in the pelvis.  IMPRESSION: Findings consistent with early intrauterine gestation visualizing gestational sac pain yolk sac currently. Small subchorionic hemorrhage noted.  Recommend interval follow-up with beta HCG and ultrasound.  MDM Discussed lab results and ultrasound results with patient.    Assessment and Plan  IUP at 9375w0d with small subchorionic hemorrhage noted on US  Plan Verification of pregnancy given To apply for medicaid Plans [facility] [place] No smoking, no drugs, no alcohol.  Take a prenatal vitamin one by mouth every day.  Eat small frequent snacks to avoid nausea.  Begin prenatal care as soon as possible. Drink at  least 8 8-oz glasses of water every day. Take Tylenol 325 mg 2 tablets by mouth every 4 hours if needed for pain.   BURLESON,TERRI 07/02/2015, 7:38 PM

## 2015-07-02 NOTE — Discharge Instructions (Signed)
No smoking, no drugs, no alcohol.  Take a prenatal vitamin one by mouth every day.  Eat small frequent snacks to avoid nausea.  Begin prenatal care as soon as possible. °Drink at least 8 8-oz glasses of water every day. °Take Tylenol 325 mg 2 tablets by mouth every 4 hours if needed for pain. ° ° °

## 2015-07-02 NOTE — MAU Note (Signed)
Pt presents to MAU with complaints of missing her cycle and cramping in her abdomen. Denies any vaginal bleeding

## 2015-07-03 LAB — GC/CHLAMYDIA PROBE AMP (~~LOC~~) NOT AT ARMC
CHLAMYDIA, DNA PROBE: POSITIVE — AB
Neisseria Gonorrhea: NEGATIVE

## 2015-07-03 LAB — RPR: RPR Ser Ql: NONREACTIVE

## 2015-07-03 LAB — HIV ANTIBODY (ROUTINE TESTING W REFLEX): HIV SCREEN 4TH GENERATION: NONREACTIVE

## 2015-07-04 ENCOUNTER — Telehealth (HOSPITAL_COMMUNITY): Payer: Self-pay | Admitting: *Deleted

## 2015-07-04 DIAGNOSIS — O98811 Other maternal infectious and parasitic diseases complicating pregnancy, first trimester: Principal | ICD-10-CM

## 2015-07-04 DIAGNOSIS — A749 Chlamydial infection, unspecified: Secondary | ICD-10-CM

## 2015-07-04 LAB — CULTURE, OB URINE

## 2015-07-04 MED ORDER — AZITHROMYCIN 500 MG PO TABS: ORAL_TABLET | ORAL | Status: DC

## 2015-07-04 NOTE — Telephone Encounter (Signed)
Telephone call to patient regarding positive chlamydia culture, patient notified.  Rx routed to patients pharmacy per protocol.  Instructed patient to notify her partner for treatment and to abstain from sex for seven days post treatment.  Report faxed to health department.  

## 2015-07-12 ENCOUNTER — Inpatient Hospital Stay (HOSPITAL_COMMUNITY): Payer: No Typology Code available for payment source

## 2015-07-12 ENCOUNTER — Inpatient Hospital Stay (HOSPITAL_COMMUNITY)
Admission: AD | Admit: 2015-07-12 | Discharge: 2015-07-12 | Disposition: A | Discharge status: Home / Self Care | Payer: No Typology Code available for payment source | Source: Ambulatory Visit | Attending: Family Medicine | Admitting: Family Medicine

## 2015-07-12 ENCOUNTER — Encounter (HOSPITAL_COMMUNITY): Payer: Self-pay | Admitting: *Deleted

## 2015-07-12 DIAGNOSIS — O209 Hemorrhage in early pregnancy, unspecified: Secondary | ICD-10-CM

## 2015-07-12 DIAGNOSIS — F17210 Nicotine dependence, cigarettes, uncomplicated: Secondary | ICD-10-CM | POA: Insufficient documentation

## 2015-07-12 DIAGNOSIS — O26891 Other specified pregnancy related conditions, first trimester: Secondary | ICD-10-CM

## 2015-07-12 DIAGNOSIS — O034 Incomplete spontaneous abortion without complication: Secondary | ICD-10-CM | POA: Diagnosis not present

## 2015-07-12 DIAGNOSIS — Z3A10 10 weeks gestation of pregnancy: Secondary | ICD-10-CM | POA: Diagnosis not present

## 2015-07-12 DIAGNOSIS — R102 Pelvic and perineal pain: Secondary | ICD-10-CM

## 2015-07-12 DIAGNOSIS — O26851 Spotting complicating pregnancy, first trimester: Secondary | ICD-10-CM | POA: Diagnosis present

## 2015-07-12 LAB — URINALYSIS, ROUTINE W REFLEX MICROSCOPIC
Bilirubin Urine: NEGATIVE
GLUCOSE, UA: NEGATIVE mg/dL
Ketones, ur: NEGATIVE mg/dL
LEUKOCYTES UA: NEGATIVE
Nitrite: NEGATIVE
PH: 6 (ref 5.0–8.0)
Protein, ur: NEGATIVE mg/dL
Specific Gravity, Urine: 1.02 (ref 1.005–1.030)
Urobilinogen, UA: 0.2 mg/dL (ref 0.0–1.0)

## 2015-07-12 LAB — CBC
HCT: 37 % (ref 36.0–46.0)
HEMOGLOBIN: 12 g/dL (ref 12.0–15.0)
MCH: 27.3 pg (ref 26.0–34.0)
MCHC: 32.4 g/dL (ref 30.0–36.0)
MCV: 84.3 fL (ref 78.0–100.0)
Platelets: 287 10*3/uL (ref 150–400)
RBC: 4.39 MIL/uL (ref 3.87–5.11)
RDW: 13.4 % (ref 11.5–15.5)
WBC: 5.8 10*3/uL (ref 4.0–10.5)

## 2015-07-12 LAB — URINE MICROSCOPIC-ADD ON

## 2015-07-12 NOTE — MAU Provider Note (Signed)
Chief Complaint: Vaginal Bleeding   First Provider Initiated Contact with Patient 07/12/15 1039      SUBJECTIVE HPI: Robin [name] is a 24 y.o. G3P0011 at [redacted]w[redacted]d by LMP who presents to maternity admissions reporting onset of pink, light red spotting 2 days ago.  She was seen on 07/02/15 in MAU and had early IUP with gestational sac and yold sac on ultrasound with small subchorionic hemorrhage.  She has not yet started prenatal care.   She denies abdominal pain, vaginal itching/burning, urinary symptoms, h/a, dizziness, n/v, or fever/chills.     Vaginal Bleeding The patient's primary symptoms include vaginal bleeding. The patient's pertinent negatives include no pelvic pain or vaginal discharge. This is a new problem. The current episode started in the past 7 days. The problem occurs intermittently. The problem has been unchanged. The patient is experiencing no pain. She is pregnant. Pertinent negatives include no abdominal pain, back pain, chills, constipation, diarrhea, dysuria, fever, flank pain, frequency, headaches, nausea, urgency or vomiting. The vaginal bleeding is spotting. She has not been passing clots. She has not been passing tissue. Nothing aggravates the symptoms. She has tried nothing for the symptoms. No, her partner does not have an STD.    Past Medical History  Diagnosis Date  . Asthma    History reviewed. No pertinent past surgical history. Social History   Social History  . Marital Status: Single    Spouse Name: N/A  . Number of Children: N/A  . Years of Education: N/A   Occupational History  . Not on file.   Social History Main Topics  . Smoking status: Current Every Day Smoker -- 1.00 packs/day    Types: Cigarettes  . Smokeless tobacco: Not on file  . Alcohol Use: Yes     Comment: occasionally  . Drug Use: Not on file  . Sexual Activity: Yes   Other Topics Concern  . Not on file   Social History Narrative   No current facility-administered  medications on file prior to encounter.   Current Outpatient Prescriptions on File Prior to Encounter  Medication Sig Dispense Refill  . acetaminophen (TYLENOL) 500 MG tablet Take 1,000 mg by mouth every 6 (six) hours as needed for mild pain.    Marland Kitchen albuterol (PROVENTIL HFA;VENTOLIN HFA) 108 (90 BASE) MCG/ACT inhaler Inhale 2 puffs into the lungs every 4 (four) hours as needed for wheezing or shortness of breath.     No Known Allergies  ROS:  Review of Systems  Constitutional: Negative for fever, chills and fatigue.  HENT: Negative for sinus pressure.   Eyes: Negative for photophobia.  Respiratory: Negative for shortness of breath.   Cardiovascular: Negative for chest pain.  Gastrointestinal: Negative for nausea, vomiting, abdominal pain, diarrhea and constipation.  Genitourinary: Positive for vaginal bleeding. Negative for dysuria, urgency, frequency, flank pain, vaginal discharge, difficulty urinating, vaginal pain and pelvic pain.  Musculoskeletal: Negative for back pain and neck pain.  Neurological: Negative for dizziness, weakness and headaches.  Psychiatric/Behavioral: Negative.      I have reviewed patient's Past Medical Hx, Surgical Hx, Family Hx, Social Hx, medications and allergies.   Physical Exam   Patient Vitals for the past 24 hrs:  BP Temp Pulse Resp  07/12/15 0941 143/83 mmHg 98.2 F (36.8 C) 74 18    Constitutional: Well-developed, well-nourished female in no acute distress.  Cardiovascular: normal rate Respiratory: normal effort GI: Abd soft, non-tender. Pos BS x 4 MS: Extremities nontender, no edema, normal ROM Neurologic: Alert and  oriented x 4.  GU: Neg CVAT.  PELVIC EXAM: Deferred   LAB RESULTS Results for orders placed or performed during the hospital encounter of 07/12/15 (from the past 24 hour(s))  Urinalysis, Routine w reflex microscopic (not [facility])     Status: Abnormal   Collection Time: 07/12/15  9:40 AM  Result Value Ref Range   Color,  Urine YELLOW YELLOW   APPearance CLEAR CLEAR   Specific Gravity, Urine 1.020 1.005 - 1.030   pH 6.0 5.0 - 8.0   Glucose, UA NEGATIVE NEGATIVE mg/dL   Hgb urine dipstick LARGE (A) NEGATIVE   Bilirubin Urine NEGATIVE NEGATIVE   Ketones, ur NEGATIVE NEGATIVE mg/dL   Protein, ur NEGATIVE NEGATIVE mg/dL   Urobilinogen, UA 0.2 0.0 - 1.0 mg/dL   Nitrite NEGATIVE NEGATIVE   Leukocytes, UA NEGATIVE NEGATIVE  Urine microscopic-add on     Status: Abnormal   Collection Time: 07/12/15  9:40 AM  Result Value Ref Range   Squamous Epithelial / LPF FEW (A) RARE   RBC / HPF 3-6 <3 RBC/hpf   Bacteria, UA RARE RARE   Urine-Other MUCOUS PRESENT   CBC     Status: None   Collection Time: 07/12/15 10:20 AM  Result Value Ref Range   WBC 5.8 4.0 - 10.5 K/uL   RBC 4.39 3.87 - 5.11 MIL/uL   Hemoglobin 12.0 12.0 - 15.0 g/dL   HCT 16.137.0 09.636.0 - 04.546.0 %   MCV 84.3 78.0 - 100.0 fL   MCH 27.3 26.0 - 34.0 pg   MCHC 32.4 30.0 - 36.0 g/dL   RDW 40.913.4 81.111.5 - 91.415.5 %   Platelets 287 150 - 400 K/uL    --/--/A POS (10/30 1755)  IMAGING [place] Ob Comp Less 14 Wks  07/02/2015  CLINICAL DATA:  First trimester pregnancy, quantitative beta HCG 1,948, cramping for 1 day EXAM: OBSTETRIC <14 WK US AND TRANSVAGINAL OB US TECHNIQUE: Both transabdominal and transvaginal ultrasound examinations were performed for complete evaluation of the gestation as well as the maternal uterus, adnexal regions, and pelvic cul-de-sac. Transvaginal technique was performed to assess early pregnancy. COMPARISON:  None. FINDINGS: Intrauterine gestational sac: Visualized/normal in shape. Yolk sac:  Present Embryo:  Not seen Cardiac Activity: Not seen MSD: 12  mm   6 w   0  d Maternal uterus/adnexae: Possible small subchorionic hemorrhage. Ovaries are normal but difficult to visualize in detail. No free fluid in the pelvis. IMPRESSION: Findings consistent with early intrauterine gestation visualizing gestational sac pain yolk sac currently. Small subchorionic  hemorrhage noted. Recommend interval follow-up with beta HCG and ultrasound. Electronically Signed   By: Esperanza Heiraymond  Rubner M.D.   On: 07/02/2015 19:35   [place] Ob Transvaginal  07/12/2015  CLINICAL DATA:  Patient with pelvic pain and bleeding for 2 days. EXAM: TRANSVAGINAL OB ULTRASOUND TECHNIQUE: Transvaginal ultrasound was performed for complete evaluation of the gestation as well as the maternal uterus, adnexal regions, and pelvic cul-de-sac. COMPARISON:  Pelvic ultrasound 07/02/2015 FINDINGS: Intrauterine gestational sac: Visualized/normal in shape. Yolk sac:  Present Embryo:  Not present Cardiac Activity: Not present MSD: 13.4  mm   6 w   1  d Maternal uterus/adnexae: Normal right and left ovaries. Trace free fluid in the pelvis. Small subchorionic hemorrhage. IMPRESSION: Intrauterine gestational sac and yolk sac. No embryo identified. Findings are suspicious but not yet definitive for failed pregnancy. Recommend follow-up US in 10-14 days for definitive diagnosis. This recommendation follows SRU consensus guidelines: Diagnostic Criteria for Nonviable Pregnancy Early in  the First Trimester. Malva Limes Med 2013; 409:8119-14. Electronically Signed   By: Annia Belt M.D.   On: 07/12/2015 11:45   US Ob Transvaginal  07/02/2015  CLINICAL DATA:  First trimester pregnancy, quantitative beta HCG 1,948, cramping for 1 day EXAM: OBSTETRIC <14 WK [place] AND TRANSVAGINAL OB US TECHNIQUE: Both transabdominal and transvaginal ultrasound examinations were performed for complete evaluation of the gestation as well as the maternal uterus, adnexal regions, and pelvic cul-de-sac. Transvaginal technique was performed to assess early pregnancy. COMPARISON:  None. FINDINGS: Intrauterine gestational sac: Visualized/normal in shape. Yolk sac:  Present Embryo:  Not seen Cardiac Activity: Not seen MSD: 12  mm   6 w   0  d Maternal uterus/adnexae: Possible small subchorionic hemorrhage. Ovaries are normal but difficult to visualize in detail.  No free fluid in the pelvis. IMPRESSION: Findings consistent with early intrauterine gestation visualizing gestational sac pain yolk sac currently. Small subchorionic hemorrhage noted. Recommend interval follow-up with beta HCG and ultrasound. Electronically Signed   By: Esperanza Heir M.D.   On: 07/02/2015 19:35    MAU Management/MDM: Ordered labs and imaging and reviewed results.  Consult Dr Shawnie Pons to review imaging results.  Likely failed pregnancy/incomplete miscarriage.  Discussed with pt, recommend follow up if no onset of heavy bleeding within one week.  F/U appointment made for pt in 2 weeks but pt to call clinic in 1 week if heavier bleeding does not start.  Bleeding precautions given, reasons for pt to return to MAU. Pt stable at time of discharge.   ASSESSMENT 1. Incomplete miscarriage   2. Vaginal bleeding in pregnancy, first trimester   3. Pelvic pain affecting pregnancy in first trimester, antepartum     PLAN Discharge home with bleeding precautions   Follow-up Information    Follow up with [facility].   Specialty:  Obstetrics and Gynecology   Why:  The clinic will call you with appointment.   Contact information:   [address] [place] [address] [phone]      Sharen Counter Certified Nurse-Midwife 07/12/2015  3:53 PM

## 2015-07-12 NOTE — MAU Note (Signed)
Pt presents to MAU with of vaginal bleeding or two days. Reports lower abdominal cramping. + home pregnancy test two weeks ago

## 2015-07-12 NOTE — Discharge Instructions (Signed)
Miscarriage  A miscarriage is the sudden loss of an unborn baby (fetus) before the 20th week of pregnancy. Most miscarriages happen in the first 3 months of pregnancy. Sometimes, it happens before a woman even knows she is pregnant. A miscarriage is also called a "spontaneous miscarriage" or "early pregnancy loss." Having a miscarriage can be an emotional experience. Talk with your caregiver about any questions you may have about miscarrying, the grieving process, and your future pregnancy plans.  CAUSES    Problems with the fetal chromosomes that make it impossible for the baby to develop normally. Problems with the baby's genes or chromosomes are most often the result of errors that occur, by chance, as the embryo divides and grows. The problems are not inherited from the parents.   Infection of the cervix or uterus.    Hormone problems.    Problems with the cervix, such as having an incompetent cervix. This is when the tissue in the cervix is not strong enough to hold the pregnancy.    Problems with the uterus, such as an abnormally shaped uterus, uterine fibroids, or congenital abnormalities.    Certain medical conditions.    Smoking, drinking alcohol, or taking illegal drugs.    Trauma.   Often, the cause of a miscarriage is unknown.   SYMPTOMS    Vaginal bleeding or spotting, with or without cramps or pain.   Pain or cramping in the abdomen or lower back.   Passing fluid, tissue, or blood clots from the vagina.  DIAGNOSIS   Your caregiver will perform a physical exam. You may also have an ultrasound to confirm the miscarriage. Blood or urine tests may also be ordered.  TREATMENT    Sometimes, treatment is not necessary if you naturally pass all the fetal tissue that was in the uterus. If some of the fetus or placenta remains in the body (incomplete miscarriage), tissue left behind may become infected and must be removed. Usually, a dilation and curettage (D and C) procedure is performed.  During a D and C procedure, the cervix is widened (dilated) and any remaining fetal or placental tissue is gently removed from the uterus.   Antibiotic medicines are prescribed if there is an infection. Other medicines may be given to reduce the size of the uterus (contract) if there is a lot of bleeding.   If you have Rh negative blood and your baby was Rh positive, you will need a Rh immunoglobulin shot. This shot will protect any future baby from having Rh blood problems in future pregnancies.  HOME CARE INSTRUCTIONS    Your caregiver may order bed rest or may allow you to continue light activity. Resume activity as directed by your caregiver.   Have someone help with home and family responsibilities during this time.    Keep track of the number of sanitary pads you use each day and how soaked (saturated) they are. Write down this information.    Do not use tampons. Do not douche or have sexual intercourse until approved by your caregiver.    Only take over-the-counter or prescription medicines for pain or discomfort as directed by your caregiver.    Do not take aspirin. Aspirin can cause bleeding.    Keep all follow-up appointments with your caregiver.    If you or your partner have problems with grieving, talk to your caregiver or seek counseling to help cope with the pregnancy loss. Allow enough time to grieve before trying to get pregnant again.     SEEK IMMEDIATE MEDICAL CARE IF:    You have severe cramps or pain in your back or abdomen.   You have a fever.   You pass large blood clots (walnut-sized or larger) ortissue from your vagina. Save any tissue for your caregiver to inspect.    Your bleeding increases.    You have a thick, bad-smelling vaginal discharge.   You become lightheaded, weak, or you faint.    You have chills.   MAKE SURE YOU:   Understand these instructions.   Will watch your condition.   Will get help right away if you are not doing well or get worse.     This  information is not intended to replace advice given to you by your health care provider. Make sure you discuss any questions you have with your health care provider.     Document Released: 02/12/2001 Document Revised: 12/14/2012 Document Reviewed: 10/08/2011  Elsevier Interactive Patient Education 2016 [employer]

## 2015-08-02 ENCOUNTER — Encounter: Payer: Self-pay | Admitting: Obstetrics and Gynecology

## 2015-08-02 ENCOUNTER — Ambulatory Visit (INDEPENDENT_AMBULATORY_CARE_PROVIDER_SITE_OTHER): Payer: No Typology Code available for payment source | Admitting: Obstetrics and Gynecology

## 2015-08-02 VITALS — BP 131/82 | HR 62 | Temp 98.8°F | Ht 61.0 in | Wt 287.0 lb

## 2015-08-02 DIAGNOSIS — O039 Complete or unspecified spontaneous abortion without complication: Secondary | ICD-10-CM

## 2015-08-02 NOTE — Progress Notes (Signed)
Patient ID: Robin FieldingCierra S [name], female   DOB: May 11, 1991, 24 y.o.   MRN: [id number] 24 yo G3P1021 presenting today as an MAU follow up. Patient was diagnosed with an incomplete abortion on 11/9. She reports vaginal bleeding for 14 days which has since stopped. She currently denies abdominal/pelvic pain. This was not a planned pregnancy but was a welcomed pregnancy. She is not interested in starting contraception at this time.  Past Medical History  Diagnosis Date  . Asthma    No past surgical history on file. No family history on file. Social History  Substance Use Topics  . Smoking status: Current Every Day Smoker -- 1.00 packs/day    Types: Cigarettes  . Smokeless tobacco: Not on file  . Alcohol Use: Yes     Comment: occasionally   ROS See pertinent in HPI  GENERAL: Well-developed, well-nourished female in no acute distress.  ABDOMEN: Soft, nontender, nondistended. No organomegaly. PELVIC: Not indicated EXTREMITIES: No cyanosis, clubbing, or edema, 2+ distal pulses.  A/P 24 yo s/p spontaneous abortion - Will verify quant HCG today and weekly until negative - Patient is advised to wait 3 normal cycles before trying to conceive again - Patient to start prenatal vitamins - RTC prn

## 2015-08-03 LAB — HCG, QUANTITATIVE, PREGNANCY: hCG, Beta Chain, Quant, S: <2 m[IU]/mL

## 2015-08-04 ENCOUNTER — Telehealth: Payer: Self-pay

## 2015-08-04 NOTE — Telephone Encounter (Signed)
Per Dr. Jolayne Pantheronstant, pt pregnancy has resolved and no further blood work needed.  Called pt and unable LM due to "the person you are trying to call is not available at this time."

## 2015-08-07 ENCOUNTER — Encounter: Payer: Self-pay | Admitting: *Deleted

## 2015-08-07 NOTE — Telephone Encounter (Signed)
Letter to patient

## 2015-08-16 ENCOUNTER — Telehealth: Payer: Self-pay | Admitting: *Deleted

## 2015-08-16 NOTE — Telephone Encounter (Signed)
Pt left message requesting test results. Call returned to pt and she was informed of pregnancy hormone level indication complete resolution of pregnancy. No further lab work or tests are needed. Pt voiced understanding.

## 2015-10-28 ENCOUNTER — Encounter (HOSPITAL_COMMUNITY): Payer: Self-pay | Admitting: *Deleted

## 2015-10-28 ENCOUNTER — Inpatient Hospital Stay (HOSPITAL_COMMUNITY)
Admission: AD | Admit: 2015-10-28 | Discharge: 2015-10-28 | Disposition: A | Discharge status: Home / Self Care | Payer: Self-pay | Source: Ambulatory Visit | Attending: Obstetrics & Gynecology | Admitting: Obstetrics & Gynecology

## 2015-10-28 ENCOUNTER — Inpatient Hospital Stay (HOSPITAL_COMMUNITY): Payer: Self-pay

## 2015-10-28 DIAGNOSIS — O26891 Other specified pregnancy related conditions, first trimester: Secondary | ICD-10-CM | POA: Insufficient documentation

## 2015-10-28 DIAGNOSIS — Z3A11 11 weeks gestation of pregnancy: Secondary | ICD-10-CM | POA: Insufficient documentation

## 2015-10-28 DIAGNOSIS — R112 Nausea with vomiting, unspecified: Secondary | ICD-10-CM | POA: Insufficient documentation

## 2015-10-28 DIAGNOSIS — O99331 Smoking (tobacco) complicating pregnancy, first trimester: Secondary | ICD-10-CM | POA: Insufficient documentation

## 2015-10-28 DIAGNOSIS — J45909 Unspecified asthma, uncomplicated: Secondary | ICD-10-CM | POA: Insufficient documentation

## 2015-10-28 DIAGNOSIS — R109 Unspecified abdominal pain: Secondary | ICD-10-CM | POA: Insufficient documentation

## 2015-10-28 DIAGNOSIS — O26899 Other specified pregnancy related conditions, unspecified trimester: Secondary | ICD-10-CM

## 2015-10-28 DIAGNOSIS — O219 Vomiting of pregnancy, unspecified: Secondary | ICD-10-CM

## 2015-10-28 LAB — CBC WITH DIFFERENTIAL/PLATELET
BASOS ABS: 0 10*3/uL (ref 0.0–0.1)
BASOS PCT: 0 %
Eosinophils Absolute: 0 10*3/uL (ref 0.0–0.7)
Eosinophils Relative: 0 %
HEMATOCRIT: 35.3 % — AB (ref 36.0–46.0)
HEMOGLOBIN: 11.8 g/dL — AB (ref 12.0–15.0)
LYMPHS PCT: 24 %
Lymphs Abs: 2.6 10*3/uL (ref 0.7–4.0)
MCH: 27.6 pg (ref 26.0–34.0)
MCHC: 33.4 g/dL (ref 30.0–36.0)
MCV: 82.5 fL (ref 78.0–100.0)
Monocytes Absolute: 0.6 10*3/uL (ref 0.1–1.0)
Monocytes Relative: 6 %
NEUTROS ABS: 7.3 10*3/uL (ref 1.7–7.7)
NEUTROS PCT: 69 %
Platelets: 290 10*3/uL (ref 150–400)
RBC: 4.28 MIL/uL (ref 3.87–5.11)
RDW: 13.1 % (ref 11.5–15.5)
WBC: 10.5 10*3/uL (ref 4.0–10.5)

## 2015-10-28 LAB — WET PREP, GENITAL
CLUE CELLS WET PREP: NONE SEEN
SPERM: NONE SEEN
TRICH WET PREP: NONE SEEN
YEAST WET PREP: NONE SEEN

## 2015-10-28 LAB — URINALYSIS, ROUTINE W REFLEX MICROSCOPIC
Bilirubin Urine: NEGATIVE
GLUCOSE, UA: NEGATIVE mg/dL
Ketones, ur: NEGATIVE mg/dL
LEUKOCYTES UA: NEGATIVE
Nitrite: NEGATIVE
PROTEIN: NEGATIVE mg/dL
SPECIFIC GRAVITY, URINE: 1.02 (ref 1.005–1.030)
pH: 6.5 (ref 5.0–8.0)

## 2015-10-28 LAB — HCG, QUANTITATIVE, PREGNANCY: hCG, Beta Chain, Quant, S: 24539 m[IU]/mL — ABNORMAL HIGH (ref <5)

## 2015-10-28 LAB — URINE MICROSCOPIC-ADD ON

## 2015-10-28 LAB — POCT PREGNANCY, URINE: PREG TEST UR: POSITIVE — AB

## 2015-10-28 MED ORDER — PROMETHAZINE HCL 12.5 MG PO TABS: 12.5000 mg | ORAL_TABLET | Freq: Four times a day (QID) | ORAL | Status: DC | PRN

## 2015-10-28 NOTE — Discharge Instructions (Signed)
Eating Plan for N/V in pregnancy Severe cases of hyperemesis gravidarum can lead to dehydration and malnutrition. The hyperemesis eating plan is one way to lessen the symptoms of nausea and vomiting. It is often used with prescribed medicines to control your symptoms.  WHAT CAN I DO TO RELIEVE MY SYMPTOMS? Listen to your body. Everyone is different and has different preferences. Find what works best for you. Some of the following things may help:  Eat and drink slowly.  Eat 5-6 small meals daily instead of 3 large meals.   Eat crackers before you get out of bed in the morning.   Starchy foods are usually well tolerated (such as cereal, toast, bread, potatoes, pasta, rice, and pretzels).   Ginger may help with nausea. Add  tsp ground ginger to hot tea or choose ginger tea.   Try drinking 100% fruit juice or an electrolyte drink.  Continue to take your prenatal vitamins as directed by your health care provider. If you are having trouble taking your prenatal vitamins, talk with your health care provider about different options.  Include at least 1 serving of protein with your meals and snacks (such as meats or poultry, beans, nuts, eggs, or yogurt). Try eating a protein-rich snack before bed (such as cheese and crackers or a half [place] or peanut butter sandwich). WHAT THINGS SHOULD I AVOID TO REDUCE MY SYMPTOMS? The following things may help reduce your symptoms:  Avoid foods with strong smells. Try eating meals in well-ventilated areas that are free of odors.  Avoid drinking water or other beverages with meals. Try not to drink anything less than 30 minutes before and after meals.  Avoid drinking more than 1 cup of fluid at a time.  Avoid fried or high-fat foods, such as butter and cream sauces.  Avoid spicy foods.  Avoid skipping meals the best you can. Nausea can be more intense on an empty stomach. If you cannot tolerate food at that time, do not force it. Try sucking on ice  chips or other frozen items and make up the calories later.  Avoid lying down within 2 hours after eating.   This information is not intended to replace advice given to you by your health care provider. Make sure you discuss any questions you have with your health care provider.   Document Released: 06/16/2007 Document Revised: 08/24/2013 Document Reviewed: 06/23/2013 Elsevier Interactive Patient Education [employer] First Trimester of Pregnancy The first trimester of pregnancy is from week 1 until the end of week 12 (months 1 through 3). During this time, your baby will begin to develop inside you. At 6-8 weeks, the eyes and face are formed, and the heartbeat can be seen on ultrasound. At the end of 12 weeks, all the baby's organs are formed. Prenatal care is all the medical care you receive before the birth of your baby. Make sure you get good prenatal care and follow all of your doctor's instructions. HOME CARE  Medicines  Take medicine only as told by your doctor. Some medicines are safe and some are not during pregnancy.  Take your prenatal vitamins as told by your doctor.  Take medicine that helps you poop (stool softener) as needed if your doctor says it is okay. Diet  Eat regular, healthy meals.  Your doctor will tell you the amount of weight gain that is right for you.  Avoid raw meat and uncooked cheese.  If you feel sick to your stomach (nauseous) or throw up (vomit):  Eat 4 or 5 small meals a day instead of 3 large meals.  Try eating a few soda crackers.  Drink liquids between meals instead of during meals.  If you have a hard time pooping (constipation):  Eat high-fiber foods like fresh vegetables, fruit, and whole grains.  Drink enough fluids to keep your pee (urine) clear or pale yellow. Activity and Exercise  Exercise only as told by your doctor. Stop exercising if you have cramps or pain in your lower belly (abdomen) or low back.  Try to avoid  standing for long periods of time. Move your legs often if you must stand in one place for a long time.  Avoid heavy lifting.  Wear low-heeled shoes. Sit and stand up straight.  You can have sex unless your doctor tells you not to. Relief of Pain or Discomfort  Wear a good support bra if your breasts are sore.  Take warm water baths (sitz baths) to soothe pain or discomfort caused by hemorrhoids. Use hemorrhoid cream if your doctor says it is okay.  Rest with your legs raised if you have leg cramps or low back pain.  Wear support hose if you have puffy, bulging veins (varicose veins) in your legs. Raise (elevate) your feet for 15 minutes, 3-4 times a day. Limit salt in your diet. Prenatal Care  Schedule your prenatal visits by the twelfth week of pregnancy.  Write down your questions. Take them to your prenatal visits.  Keep all your prenatal visits as told by your doctor. Safety  Wear your seat belt at all times when driving.  Make a list of emergency phone numbers. The list should include numbers for family, friends, the hospital, and police and fire departments. General Tips  Ask your doctor for a referral to a local prenatal class. Begin classes no later than at the start of month 6 of your pregnancy.  Ask for help if you need counseling or help with nutrition. Your doctor can give you advice or tell you where to go for help.  Do not use hot tubs, steam rooms, or saunas.  Do not douche or use tampons or scented sanitary pads.  Do not cross your legs for long periods of time.  Avoid litter boxes and soil used by cats.  Avoid all smoking, herbs, and alcohol. Avoid drugs not approved by your doctor.  Do not use any tobacco products, including cigarettes, chewing tobacco, and electronic cigarettes. If you need help quitting, ask your doctor. You may get counseling or other support to help you quit.  Visit your dentist. At home, brush your teeth with a soft toothbrush. Be  gentle when you floss. GET HELP IF:  You are dizzy.  You have mild cramps or pressure in your lower belly.  You have a nagging pain in your belly area.  You continue to feel sick to your stomach, throw up, or have watery poop (diarrhea).  You have a bad smelling fluid coming from your vagina.  You have pain with peeing (urination).  You have increased puffiness (swelling) in your face, hands, legs, or ankles. GET HELP RIGHT AWAY IF:   You have a fever.  You are leaking fluid from your vagina.  You have spotting or bleeding from your vagina.  You have very bad belly cramping or pain.  You gain or lose weight rapidly.  You throw up blood. It may look like coffee grounds.  You are around people who have [place] measles, fifth disease, or chickenpox.  You have a very bad headache.  You have shortness of breath.  You have any kind of trauma, such as from a fall or a car accident.   This information is not intended to replace advice given to you by your health care provider. Make sure you discuss any questions you have with your health care provider.   Document Released: 02/05/2008 Document Revised: 09/09/2014 Document Reviewed: 06/29/2013 Elsevier Interactive Patient Education [employer]

## 2015-10-28 NOTE — MAU Provider Note (Signed)
History     CSN: [id number]  Arrival date and time: 10/28/15 4098   First Provider Initiated Contact with Patient 10/28/15 1835      Chief Complaint  Patient presents with  . Fatigue  . Abdominal Pain   HPI Ms. Robin [name] is a 25 y.o. G3P0011 at [redacted]w[redacted]d who presents to MAU today with complaint of abdominal pain. The patient states 2-3 weeks of sharp intermittent lower abdominal pain. She also states ~ 2 weeks of N/V/D. She states 2 episodes of loose stools today. She denies sick contacts. She rates abdominal pain at 8/10 now. She has not taken anything for pain and appears very comfortable. She denies vaginal bleeding, discharge, UTI symptoms or fever today. Although she does endorse 1 day of fever recently.   OB History    Gravida Para Term Preterm AB TAB SAB Ectopic Multiple Living   3 1   1  1   1       Past Medical History  Diagnosis Date  . Asthma     History reviewed. No pertinent past surgical history.  History reviewed. No pertinent family history.  Social History  Substance Use Topics  . Smoking status: Current Every Day Smoker -- 1.00 packs/day    Types: Cigarettes  . Smokeless tobacco: None  . Alcohol Use: Yes     Comment: occasionally    Allergies: No Known Allergies  Prescriptions prior to admission  Medication Sig Dispense Refill Last Dose  . acetaminophen (TYLENOL) 325 MG tablet Take 650 mg by mouth every 6 (six) hours as needed for mild pain or headache.   Past Month at Unknown time    Review of Systems  Constitutional: Negative for fever and malaise/fatigue.  Gastrointestinal: Positive for nausea, vomiting and abdominal pain. Negative for diarrhea and constipation.  Genitourinary: Negative for dysuria, urgency and frequency.       Neg - vaginal bleeding, discharge   Physical Exam   Blood pressure 139/75, pulse 114, temperature 99 F (37.2 C), resp. rate 18, last menstrual period 09/02/2015, unknown if currently breastfeeding.  Physical  Exam  Nursing note and vitals reviewed. Constitutional: She is oriented to person, place, and time. She appears well-developed and well-nourished. No distress.  HENT:  Head: Normocephalic and atraumatic.  Cardiovascular: Normal rate.   Respiratory: Effort normal.  GI: Soft. She exhibits no distension and no mass. There is no tenderness. There is no rebound and no guarding.  Genitourinary: Uterus is not enlarged (exam limited by maternal body habitus) and not tender. Cervix exhibits no motion tenderness, no discharge and no friability. Right adnexum displays no mass and no tenderness. Left adnexum displays no mass and no tenderness. No bleeding in the vagina. Vaginal discharge (scant thin, white discharge) found.  Neurological: She is alert and oriented to person, place, and time.  Skin: Skin is warm and dry. No erythema.  Psychiatric: She has a normal mood and affect.   Results for orders placed or performed during the hospital encounter of 10/28/15 (from the past 24 hour(s))  Pregnancy, urine POC     Status: Abnormal   Collection Time: 10/28/15  6:30 PM  Result Value Ref Range   Preg Test, Ur POSITIVE (A) NEGATIVE  Urinalysis, Routine w reflex microscopic (not at [facility])     Status: Abnormal   Collection Time: 10/28/15  6:35 PM  Result Value Ref Range   Color, Urine YELLOW YELLOW   APPearance CLEAR CLEAR   Specific Gravity, Urine 1.020 1.005 - 1.030  pH 6.5 5.0 - 8.0   Glucose, UA NEGATIVE NEGATIVE mg/dL   Hgb urine dipstick TRACE (A) NEGATIVE   Bilirubin Urine NEGATIVE NEGATIVE   Ketones, ur NEGATIVE NEGATIVE mg/dL   Protein, ur NEGATIVE NEGATIVE mg/dL   Nitrite NEGATIVE NEGATIVE   Leukocytes, UA NEGATIVE NEGATIVE  Urine microscopic-add on     Status: Abnormal   Collection Time: 10/28/15  6:35 PM  Result Value Ref Range   Squamous Epithelial / LPF 0-5 (A) NONE SEEN   WBC, UA 0-5 0 - 5 WBC/hpf   RBC / HPF 6-30 0 - 5 RBC/hpf   Bacteria, UA FEW (A) NONE SEEN  Wet prep, genital      Status: Abnormal   Collection Time: 10/28/15  6:40 PM  Result Value Ref Range   Yeast Wet Prep HPF POC NONE SEEN NONE SEEN   Trich, Wet Prep NONE SEEN NONE SEEN   Clue Cells Wet Prep HPF POC NONE SEEN NONE SEEN   WBC, Wet Prep HPF POC MANY (A) NONE SEEN   Sperm NONE SEEN   CBC with Differential/Platelet     Status: Abnormal   Collection Time: 10/28/15  6:52 PM  Result Value Ref Range   WBC 10.5 4.0 - 10.5 K/uL   RBC 4.28 3.87 - 5.11 MIL/uL   Hemoglobin 11.8 (L) 12.0 - 15.0 g/dL   HCT 21.3 (L) 08.6 - 57.8 %   MCV 82.5 78.0 - 100.0 fL   MCH 27.6 26.0 - 34.0 pg   MCHC 33.4 30.0 - 36.0 g/dL   RDW 46.9 62.9 - 52.8 %   Platelets 290 150 - 400 K/uL   Neutrophils Relative % 69 %   Neutro Abs 7.3 1.7 - 7.7 K/uL   Lymphocytes Relative 24 %   Lymphs Abs 2.6 0.7 - 4.0 K/uL   Monocytes Relative 6 %   Monocytes Absolute 0.6 0.1 - 1.0 K/uL   Eosinophils Relative 0 %   Eosinophils Absolute 0.0 0.0 - 0.7 K/uL   Basophils Relative 0 %   Basophils Absolute 0.0 0.0 - 0.1 K/uL  hCG, quantitative, pregnancy     Status: Abnormal   Collection Time: 10/28/15  6:52 PM  Result Value Ref Range   hCG, Beta Chain, Quant, S 24539 (H) <5 mIU/mL   US Ob Comp Less 14 Wks  10/28/2015  CLINICAL DATA:  25 year old with unclear LMP, some time around 08/12/2015 (11 weeks 0 days), presenting with abdominopelvic pain. EXAM: OBSTETRIC <14 WK ULTRASOUND TECHNIQUE: Transabdominal ultrasound was performed for evaluation of the gestation as well as the maternal uterus and adnexal regions. COMPARISON:  Early OB ultrasound 07/12/2015. FINDINGS: Intrauterine gestational sac: Single, normal in appearance. Yolk sac:  No longer visible. Embryo:  Visible. Cardiac Activity: Visible. Heart Rate: 182 bpm CRL:   46.3 mm   11 w 3 d                  [place] EDC: 05/15/2016 Subchorionic hemorrhage:  None visualized. Maternal uterus/adnexae: Right ovary normal in size in appearance measuring approximately 2.8 x 2.2 x 2.2 cm. Left ovary normal  in size in appearance measuring approximately 3.0 x 1.7 x 2.0 cm. No adnexal masses or free pelvic fluid. IMPRESSION: 1. Single live intrauterine fetus with estimated gestational age [redacted] weeks 3 days by crown-rump length, correlating very well with the estimated gestational age by LMP of 11 weeks 0 days. Ultrasound [facility] 05/15/2016. 2. Normal-appearing ovaries. No adnexal masses or free pelvic fluid. Electronically Signed  By: Hulan Saas M.D.   On: 10/28/2015 20:08    MAU Course  Procedures None  MDM +UPT UA, wet prep, GC/chlamydia, CBC, quant hCG, HIV, RPR and [place] today to rule out ectopic pregnancy A+ blood type in Epic from previous visit  Assessment and Plan  A: SIUP at [redacted]w[redacted]d Abdominal pain in pregnancy Nausea and vomiting in pregnancy prior to [redacted] weeks gestation  P: Discharge home Rx for Phenergan given to patient First trimester precautions discussed Patient advised to follow-up with [facility] as planned to start prenatal care Pregnancy confirmation letter given  Patient may return to MAU as needed or if her condition were to change or worsen   Marny Lowenstein, PA-C  10/28/2015, 8:13 PM

## 2015-10-28 NOTE — MAU Note (Signed)
Pt presents to MAU with complaints of feeling fatigued and sharp pains in the lower part of her abdomen. Pt denies any vaginal bleeding or abnormal discharge. LMP the end of December

## 2015-10-29 LAB — RPR: RPR Ser Ql: NONREACTIVE

## 2015-10-29 LAB — HIV ANTIBODY (ROUTINE TESTING W REFLEX): HIV Screen 4th Generation wRfx: NONREACTIVE

## 2015-10-30 LAB — GC/CHLAMYDIA PROBE AMP (~~LOC~~) NOT AT ARMC
Chlamydia: POSITIVE — AB
Neisseria Gonorrhea: NEGATIVE

## 2015-11-01 ENCOUNTER — Telehealth: Payer: Self-pay | Admitting: *Deleted

## 2015-11-01 ENCOUNTER — Encounter (HOSPITAL_COMMUNITY): Payer: Self-pay | Admitting: *Deleted

## 2015-11-01 MED ORDER — AZITHROMYCIN 250 MG PO TABS: ORAL_TABLET | ORAL | Status: DC

## 2015-11-01 NOTE — Telephone Encounter (Addendum)
Called pt and left message stating that I am calling with test result information. Please call abck and state whether a detailed message can be left on your voice mail. *Pt needs to be informed of +Chlamydia requiring treatment w/Azithromycin 1 gm.  Rx has been sent to her pharmacy. Her partner will need testing and treatment also.   3/2  1700  Per notes from Loyce Dys, pt was sent a certified letter regarding test results.  STD form faxed to [facility].

## 2015-11-06 ENCOUNTER — Encounter: Payer: Self-pay | Admitting: *Deleted

## 2015-11-07 ENCOUNTER — Telehealth: Payer: Self-pay | Admitting: Family Medicine

## 2015-11-09 NOTE — Telephone Encounter (Signed)
Called patient and informed her of positive chlamydia result. Informed her that chlamydia was a sexually transmitted disease and is treatable with antibiotics that have been sent to her pharmacy of record. Advised that her partner needs to be informed and treated as well, and that she should abstain from sex or use condoms for 7 days after both have been treated. Patient voiced understanding. She asked if chlamydia could affect her pregnancy. I told her that it could which is why it is important to get treated asap, Understanding voiced.

## 2015-12-02 ENCOUNTER — Inpatient Hospital Stay (HOSPITAL_COMMUNITY)
Admission: AD | Admit: 2015-12-02 | Discharge: 2015-12-02 | Disposition: A | Discharge status: Home / Self Care | Payer: BLUE CROSS/BLUE SHIELD | Source: Ambulatory Visit | Attending: Obstetrics & Gynecology | Admitting: Obstetrics & Gynecology

## 2015-12-02 ENCOUNTER — Encounter (HOSPITAL_COMMUNITY): Payer: Self-pay | Admitting: *Deleted

## 2015-12-02 DIAGNOSIS — F17210 Nicotine dependence, cigarettes, uncomplicated: Secondary | ICD-10-CM | POA: Insufficient documentation

## 2015-12-02 DIAGNOSIS — R1030 Lower abdominal pain, unspecified: Secondary | ICD-10-CM | POA: Insufficient documentation

## 2015-12-02 DIAGNOSIS — O26899 Other specified pregnancy related conditions, unspecified trimester: Secondary | ICD-10-CM

## 2015-12-02 DIAGNOSIS — Z3A16 16 weeks gestation of pregnancy: Secondary | ICD-10-CM | POA: Diagnosis not present

## 2015-12-02 DIAGNOSIS — O26892 Other specified pregnancy related conditions, second trimester: Secondary | ICD-10-CM | POA: Insufficient documentation

## 2015-12-02 DIAGNOSIS — O9989 Other specified diseases and conditions complicating pregnancy, childbirth and the puerperium: Secondary | ICD-10-CM

## 2015-12-02 DIAGNOSIS — K5900 Constipation, unspecified: Secondary | ICD-10-CM | POA: Insufficient documentation

## 2015-12-02 DIAGNOSIS — O99332 Smoking (tobacco) complicating pregnancy, second trimester: Secondary | ICD-10-CM | POA: Diagnosis not present

## 2015-12-02 DIAGNOSIS — R109 Unspecified abdominal pain: Secondary | ICD-10-CM

## 2015-12-02 DIAGNOSIS — O99612 Diseases of the digestive system complicating pregnancy, second trimester: Secondary | ICD-10-CM

## 2015-12-02 LAB — WET PREP, GENITAL
CLUE CELLS WET PREP: NONE SEEN
Sperm: NONE SEEN
Trich, Wet Prep: NONE SEEN
Yeast Wet Prep HPF POC: NONE SEEN

## 2015-12-02 LAB — URINE MICROSCOPIC-ADD ON

## 2015-12-02 LAB — URINALYSIS, ROUTINE W REFLEX MICROSCOPIC
Bilirubin Urine: NEGATIVE
GLUCOSE, UA: NEGATIVE mg/dL
Ketones, ur: NEGATIVE mg/dL
LEUKOCYTES UA: NEGATIVE
Nitrite: NEGATIVE
PH: 7.5 (ref 5.0–8.0)
PROTEIN: NEGATIVE mg/dL
Specific Gravity, Urine: 1.02 (ref 1.005–1.030)

## 2015-12-02 MED ORDER — IBUPROFEN 600 MG PO TABS: 600.0000 mg | ORAL_TABLET | Freq: Four times a day (QID) | ORAL | Status: DC | PRN

## 2015-12-02 MED ORDER — POLYETHYLENE GLYCOL 3350 17 G PO PACK: 17.0000 g | PACK | Freq: Every day | ORAL | Status: DC | PRN

## 2015-12-02 MED ORDER — IBUPROFEN 600 MG PO TABS
600.0000 mg | ORAL_TABLET | Freq: Once | ORAL | Status: AC
  Administered 2015-12-02: 600 mg via ORAL
  Filled 2015-12-02: qty 1

## 2015-12-02 NOTE — Discharge Instructions (Signed)

## 2015-12-02 NOTE — MAU Note (Signed)
Urine sent to lab 

## 2015-12-02 NOTE — MAU Provider Note (Signed)
History     CSN: [id number]  Arrival date and time: 12/02/15 1520   First Provider Initiated Contact with Patient 12/02/15 1607      Chief Complaint  Patient presents with  . Abdominal Pain  . Nausea   HPI   Robin [name] is a 25 y.o. female G3P1011 at 4639w3d presenting to MAU with abdominal pain, constipation and nausea. Patient was laying down about an hour ago eating chips when she started experiencing sharp, lower abdominal pain. The pain starts in her lower abdomen and spreads to the top of her abdomen. At times the pain radiates to her lower back. The pain is located along both sides of her lower abdomen.   She has not taken anything for the pain. She currently rates her pain 7/10. The pain comes and goes. The pain comes for 1 minute and feels like squeezing and then goes away.  The pain worsens when she lays still.  She denies vaginal bleeding. She has never had this pain before.   + constipation; has not had a BM in 3 days. Finally had a BM today and it was large and hard. She is not taking anything for the constipation.    OB History    Gravida Para Term Preterm AB TAB SAB Ectopic Multiple Living   3 1 1  1  1   1       Past Medical History  Diagnosis Date  . Asthma     Past Surgical History  Procedure Laterality Date  . No past surgeries      History reviewed. No pertinent family history.  Social History  Substance Use Topics  . Smoking status: Current Every Day Smoker -- 1.00 packs/day    Types: Cigarettes  . Smokeless tobacco: None  . Alcohol Use: No     Comment: occasionally not since pregnancy    Allergies: No Known Allergies  Prescriptions prior to admission  Medication Sig Dispense Refill Last Dose  . acetaminophen (TYLENOL) 325 MG tablet Take 650 mg by mouth every 6 (six) hours as needed for mild pain or headache.   Past Week at Unknown time  . Prenatal Vit-Fe Fumarate-FA (MULTIVITAMIN-PRENATAL) 27-0.8 MG TABS tablet Take 1 tablet by mouth  daily at 12 noon.   12/02/2015 at Unknown time  . azithromycin (ZITHROMAX) 250 MG tablet Take 4 tablets by mouth once. (Patient not taking: Reported on 12/02/2015) 4 tablet 0 Completed Course at Unknown time  . promethazine (PHENERGAN) 12.5 MG tablet Take 1 tablet (12.5 mg total) by mouth every 6 (six) hours as needed for nausea or vomiting. (Patient not taking: Reported on 12/02/2015) 30 tablet 0 Not Taking at Unknown time   Results for orders placed or performed during the hospital encounter of 12/02/15 (from the past 48 hour(s))  Urinalysis, Routine w reflex microscopic (not at [facility])     Status: Abnormal   Collection Time: 12/02/15  4:15 PM  Result Value Ref Range   Color, Urine YELLOW YELLOW   APPearance HAZY (A) CLEAR   Specific Gravity, Urine >1.030 (H) 1.005 - 1.030   pH 5.5 5.0 - 8.0   Glucose, UA NEGATIVE NEGATIVE mg/dL   Hgb urine dipstick NEGATIVE NEGATIVE   Bilirubin Urine NEGATIVE NEGATIVE   Ketones, ur 15 (A) NEGATIVE mg/dL   Protein, ur NEGATIVE NEGATIVE mg/dL   Nitrite NEGATIVE NEGATIVE   Leukocytes, UA SMALL (A) NEGATIVE  Wet prep, genital     Status: Abnormal   Collection Time: 12/02/15  4:15 PM  Result Value Ref Range   Yeast Wet Prep HPF POC NONE SEEN NONE SEEN   Trich, Wet Prep NONE SEEN NONE SEEN   Clue Cells Wet Prep HPF POC NONE SEEN NONE SEEN   WBC, Wet Prep HPF POC FEW (A) NONE SEEN    Comment: MANY BACTERIA SEEN   Sperm NONE SEEN   Urine microscopic-add on     Status: Abnormal   Collection Time: 12/02/15  4:15 PM  Result Value Ref Range   Squamous Epithelial / LPF 6-30 (A) NONE SEEN   WBC, UA 0-5 0 - 5 WBC/hpf   RBC / HPF NONE SEEN 0 - 5 RBC/hpf   Bacteria, UA MANY (A) NONE SEEN   Crystals CA OXALATE CRYSTALS (A) NEGATIVE   Urine-Other MUCOUS PRESENT     Review of Systems  Constitutional: Negative for fever and chills.  Gastrointestinal: Positive for nausea, abdominal pain and constipation. Negative for vomiting and diarrhea.  Genitourinary: Negative for  dysuria, urgency, frequency, hematuria and flank pain.   Physical Exam   Blood pressure 116/70, pulse 84, temperature 98.2 F (36.8 C), temperature source Oral, resp. rate 18, height  (1.549 m), weight 296 lb 6.4 oz (134.446 kg), last menstrual period 09/02/2015, unknown if currently breastfeeding.  Physical Exam  Constitutional: She is oriented to person, place, and time. She appears well-developed and well-nourished. No distress.  HENT:  Head: Normocephalic.  Eyes: Pupils are equal, round, and reactive to light.  Respiratory: Effort normal.  GI: There is tenderness in the right lower quadrant, periumbilical area, suprapubic area and left lower quadrant. There is no rigidity, no rebound and no guarding.  Genitourinary:  Speculum exam: Vagina - Small amount of creamy discharge, no odor Cervix - No contact bleeding Bimanual exam: Cervix closed, posterior  GC/Chlam, wet prep done Chaperone present for exam.  Musculoskeletal: Normal range of motion.  Neurological: She is alert and oriented to person, place, and time.  Skin: Skin is warm. She is not diaphoretic.  Psychiatric: Her behavior is normal.    MAU Course  Procedures  None  MDM  Wet prep GC Urine shows mild dehydration Urine culture sent and pending.  + fetal heart tones via doppler.   Assessment and Plan   A:  1. Abdominal pain in pregnancy   2. Constipation in pregnancy in second trimester     P:  Discharge home in stable condition RX: Miralax, Ibuprofen to use as needed, no more than 3 days Return to MAU if symptoms worsen Avoid fried foods; patient attests to a diet high in fried foods Increase po fluids Small, frequent meals.   Duane Lope, NP 12/02/2015 6:07 PM

## 2015-12-02 NOTE — MAU Note (Signed)
Pt reports she has had lower abd pain and back pain and cramping that feels sharp at times off and on for about 1.5 hours. Also c/o nausea. Tried to eat some chips and soda to make it feel better and it did not help.

## 2015-12-04 LAB — GC/CHLAMYDIA PROBE AMP (~~LOC~~) NOT AT ARMC
Chlamydia: NEGATIVE
Neisseria Gonorrhea: NEGATIVE

## 2015-12-05 ENCOUNTER — Encounter: Payer: Self-pay | Admitting: Advanced Practice Midwife

## 2015-12-05 ENCOUNTER — Ambulatory Visit (INDEPENDENT_AMBULATORY_CARE_PROVIDER_SITE_OTHER): Payer: BLUE CROSS/BLUE SHIELD | Admitting: Advanced Practice Midwife

## 2015-12-05 ENCOUNTER — Encounter (HOSPITAL_COMMUNITY): Payer: Self-pay | Admitting: Advanced Practice Midwife

## 2015-12-05 VITALS — BP 126/88 | HR 81 | Temp 97.8°F | Wt 233.0 lb

## 2015-12-05 DIAGNOSIS — Z3482 Encounter for supervision of other normal pregnancy, second trimester: Secondary | ICD-10-CM | POA: Diagnosis not present

## 2015-12-05 DIAGNOSIS — Z23 Encounter for immunization: Secondary | ICD-10-CM | POA: Diagnosis not present

## 2015-12-05 DIAGNOSIS — O26892 Other specified pregnancy related conditions, second trimester: Secondary | ICD-10-CM

## 2015-12-05 DIAGNOSIS — Z124 Encounter for screening for malignant neoplasm of cervix: Secondary | ICD-10-CM

## 2015-12-05 DIAGNOSIS — O0990 Supervision of high risk pregnancy, unspecified, unspecified trimester: Secondary | ICD-10-CM | POA: Insufficient documentation

## 2015-12-05 DIAGNOSIS — R12 Heartburn: Secondary | ICD-10-CM

## 2015-12-05 LAB — POCT URINALYSIS DIP (DEVICE)
BILIRUBIN URINE: NEGATIVE
GLUCOSE, UA: NEGATIVE mg/dL
KETONES UR: NEGATIVE mg/dL
Leukocytes, UA: NEGATIVE
NITRITE: NEGATIVE
PROTEIN: NEGATIVE mg/dL
Specific Gravity, Urine: 1.025 (ref 1.005–1.030)
Urobilinogen, UA: 0.2 mg/dL (ref 0.0–1.0)
pH: 5.5 (ref 5.0–8.0)

## 2015-12-05 MED ORDER — RANITIDINE HCL 150 MG PO TABS: 150.0000 mg | ORAL_TABLET | Freq: Two times a day (BID) | ORAL | Status: DC

## 2015-12-05 NOTE — Progress Notes (Signed)
    Subjective:    Robin [name] is a G3P1011 51[place] Apley16w6d being seen today for her first obstetrical visit.  Her obstetrical history is significant for NSVD x 1. Pregnancy history fully reviewed.  Patient reports pain at her umbilicus radiating into upper abdomen that is intermittent. Ceasar Mons.  Filed Vitals:   12/05/15 0905  BP: 126/88  Pulse: 81  Temp: 97.8 F (36.6 C)  Weight: 233 lb (105.688 kg)    HISTORY: OB History  Gravida Para Term Preterm AB SAB TAB Ectopic Multiple Living  3 1 1  0 1 1 0 0 0 1    # Outcome Date GA Lbr Len/2nd Weight Sex Delivery Anes PTL Lv  3 Current           2 SAB 06/27/15          1 Term 08/10/07 6254w0d  5 lb 6 oz (2.438 kg) F Vag-Spont EPI N Y     Past Medical History  Diagnosis Date  . Asthma    Past Surgical History  Procedure Laterality Date  . No past surgeries     Family History  Problem Relation Age of Onset  . Cancer Maternal Aunt   . Cancer Paternal Grandmother      Exam    Uterus:     Pelvic Exam:    Perineum: No Hemorrhoids, Normal Perineum   Vulva: normal   Vagina:  normal mucosa, normal discharge   pH:    Cervix: multiparous appearance, no bleeding following Pap and no lesions   Adnexa: normal adnexa and no mass, fullness, tenderness   Bony Pelvis: average  System: Breast:  normal appearance, no masses or tenderness   Skin: normal coloration and turgor, no rashes    Neurologic: oriented, normal, normal mood, gait normal; reflexes normal and symmetric   Extremities: normal strength, tone, and muscle mass, ROM of all joints is normal   HEENT sclera clear, anicteric, neck supple with midline trachea and thyroid without masses   Mouth/Teeth mucous membranes moist, pharynx normal without lesions and dental hygiene good   Neck supple and no masses   Cardiovascular:    Respiratory:  appears well, vitals normal, no respiratory distress, acyanotic, normal RR, ear and throat exam is normal, neck free of mass or lymphadenopathy   Abdomen: soft, non-tender; bowel sounds normal; no masses,  no organomegaly   Urinary: urethral meatus normal      Assessment:    Pregnancy: G3P1011 1. Encounter for supervision of other normal pregnancy in second trimester  - POCT urinalysis dip (device) - Prenatal Profile - Hemoglobinopathy evaluation - Culture, OB Urine - Prescript Monitor Profile(19) - Cytology - PAP - US MFM OB COMP + 14 WK; Future - Glucose Tolerance, 1 HR (50g) w/o Fasting - Flu Vaccine QUAD 36+ mos IM; Standing - Flu Vaccine QUAD 36+ mos IM - AFP, Quad Screen  2. Heartburn during pregnancy in second trimester, antepartum --Pt pain is mostly upper abdominal, and improved with treatment of constipation recently but is worse when lying down. - ranitidine (ZANTAC) 150 MG tablet; Take 1 tablet (150 mg total) by mouth 2 (two) times daily.  Dispense: 60 tablet; Refill: 0    Plan:     Initial labs drawn. Prenatal vitamins. Problem list reviewed and updated. Genetic Screening discussed Quad Screen: ordered.  Ultrasound discussed; fetal survey: ordered.  Follow up in 4 weeks.   LEFTWICH-KIRBY, Evans Levee 12/05/2015

## 2015-12-05 NOTE — Progress Notes (Signed)
Patient reports feeling a lot of abdominal pain like menstrual cramps

## 2015-12-06 LAB — CULTURE, OB URINE
Colony Count: NO GROWTH
ORGANISM ID, BACTERIA: NO GROWTH

## 2015-12-06 LAB — AFP, QUAD SCREEN
AFP: 43.7 ng/mL
Curr Gest Age: 16.9 weeks
HCG TOTAL: 7.31 [IU]/mL
INH: 169.4 pg/mL
Interpretation-AFP: NEGATIVE
MOM FOR AFP: 1.39
MOM FOR HCG: 0.27
MOM FOR INH: 1.23
OPEN SPINA BIFIDA: NEGATIVE
Osb Risk: 1:7670 {titer}
Tri 18 Scr Risk Est: NEGATIVE
UE3 MOM: 0.75
uE3 Value: 0.68 ng/mL

## 2015-12-06 LAB — GLUCOSE TOLERANCE, 1 HOUR (50G) W/O FASTING: Glucose, 1 Hr, gestational: 103 mg/dL (ref <140)

## 2015-12-06 LAB — CYTOLOGY - PAP

## 2015-12-07 LAB — PRENATAL PROFILE (SOLSTAS)
Antibody Screen: NEGATIVE
BASOS ABS: 0 {cells}/uL (ref 0–200)
Basophils Relative: 0 %
Eosinophils Absolute: 0 cells/uL — ABNORMAL LOW (ref 15–500)
Eosinophils Relative: 0 %
HEMATOCRIT: 35.8 % (ref 35.0–45.0)
HEP B S AG: NEGATIVE
HIV: NONREACTIVE
Hemoglobin: 11.8 g/dL (ref 11.7–15.5)
LYMPHS ABS: 1748 {cells}/uL (ref 850–3900)
LYMPHS PCT: 19 %
MCH: 27.2 pg (ref 27.0–33.0)
MCHC: 33 g/dL (ref 32.0–36.0)
MCV: 82.5 fL (ref 80.0–100.0)
MONO ABS: 552 {cells}/uL (ref 200–950)
MPV: 9.1 fL (ref 7.5–12.5)
Monocytes Relative: 6 %
NEUTROS PCT: 75 %
Neutro Abs: 6900 cells/uL (ref 1500–7800)
PLATELETS: 280 10*3/uL (ref 140–400)
RBC: 4.34 MIL/uL (ref 3.80–5.10)
RDW: 13.8 % (ref 11.0–15.0)
RH TYPE: POSITIVE
RUBELLA: 5.97 {index} — AB (ref <0.90)
WBC: 9.2 10*3/uL (ref 3.8–10.8)

## 2015-12-07 LAB — HEMOGLOBINOPATHY EVALUATION
HGB A2 QUANT: 2.3 % (ref 2.2–3.2)
HGB A: 97.7 % (ref 96.8–97.8)
HGB F QUANT: 0 % (ref 0.0–2.0)
Hemoglobin Other: 0 %
Hgb S Quant: 0 %

## 2015-12-09 LAB — PRESCRIPTION MONITORING PROFILE (19 PANEL)
AMPHETAMINE/METH: NEGATIVE ng/mL
Barbiturate Screen, Urine: NEGATIVE ng/mL
Benzodiazepine Screen, Urine: NEGATIVE ng/mL
Buprenorphine, Urine: NEGATIVE ng/mL
Carisoprodol, Urine: NEGATIVE ng/mL
Cocaine Metabolites: NEGATIVE ng/mL
Creatinine, Urine: 164.87 mg/dL (ref >20.0)
ECSTASY: NEGATIVE ng/mL
Fentanyl, Ur: NEGATIVE ng/mL
MEPERIDINE UR: NEGATIVE ng/mL
METHADONE SCREEN, URINE: NEGATIVE ng/mL
METHAQUALONE SCREEN (URINE): NEGATIVE ng/mL
NITRITES URINE, INITIAL: NEGATIVE ug/mL
Opiate Screen, Urine: NEGATIVE ng/mL
Oxycodone Screen, Ur: NEGATIVE ng/mL
PROPOXYPHENE: NEGATIVE ng/mL
Phencyclidine, Ur: NEGATIVE ng/mL
TRAMADOL UR: NEGATIVE ng/mL
Tapentadol, urine: NEGATIVE ng/mL
ZOLPIDEM, URINE: NEGATIVE ng/mL
pH, Initial: 5.5 pH (ref 4.5–8.9)

## 2015-12-09 LAB — CANNABANOIDS (GC/LC/MS), URINE: THC-COOH UR CONFIRM: 257 ng/mL — AB (ref <5)

## 2015-12-18 ENCOUNTER — Other Ambulatory Visit: Payer: Self-pay | Admitting: General Practice

## 2015-12-18 ENCOUNTER — Ambulatory Visit (HOSPITAL_COMMUNITY)
Admission: RE | Admit: 2015-12-18 | Discharge: 2015-12-18 | Disposition: A | Discharge status: Home / Self Care | Payer: BLUE CROSS/BLUE SHIELD | Source: Ambulatory Visit | Attending: Advanced Practice Midwife | Admitting: Advanced Practice Midwife

## 2015-12-18 DIAGNOSIS — Z3A18 18 weeks gestation of pregnancy: Secondary | ICD-10-CM | POA: Diagnosis not present

## 2015-12-18 DIAGNOSIS — O99512 Diseases of the respiratory system complicating pregnancy, second trimester: Secondary | ICD-10-CM | POA: Insufficient documentation

## 2015-12-18 DIAGNOSIS — J45909 Unspecified asthma, uncomplicated: Secondary | ICD-10-CM | POA: Insufficient documentation

## 2015-12-18 DIAGNOSIS — Z3482 Encounter for supervision of other normal pregnancy, second trimester: Secondary | ICD-10-CM

## 2015-12-18 DIAGNOSIS — O99212 Obesity complicating pregnancy, second trimester: Secondary | ICD-10-CM

## 2015-12-18 DIAGNOSIS — Z36 Encounter for antenatal screening of mother: Secondary | ICD-10-CM | POA: Diagnosis not present

## 2015-12-18 DIAGNOSIS — O99332 Smoking (tobacco) complicating pregnancy, second trimester: Secondary | ICD-10-CM | POA: Insufficient documentation

## 2016-01-09 ENCOUNTER — Ambulatory Visit (INDEPENDENT_AMBULATORY_CARE_PROVIDER_SITE_OTHER): Payer: BLUE CROSS/BLUE SHIELD | Admitting: Advanced Practice Midwife

## 2016-01-09 VITALS — BP 132/84 | HR 91 | Wt 292.0 lb

## 2016-01-09 DIAGNOSIS — Z3492 Encounter for supervision of normal pregnancy, unspecified, second trimester: Secondary | ICD-10-CM

## 2016-01-09 DIAGNOSIS — R609 Edema, unspecified: Secondary | ICD-10-CM

## 2016-01-09 DIAGNOSIS — Z3482 Encounter for supervision of other normal pregnancy, second trimester: Secondary | ICD-10-CM

## 2016-01-09 DIAGNOSIS — O2600 Excessive weight gain in pregnancy, unspecified trimester: Secondary | ICD-10-CM

## 2016-01-09 DIAGNOSIS — O2602 Excessive weight gain in pregnancy, second trimester: Secondary | ICD-10-CM

## 2016-01-09 LAB — POCT URINALYSIS DIP (DEVICE)
BILIRUBIN URINE: NEGATIVE
GLUCOSE, UA: NEGATIVE mg/dL
KETONES UR: NEGATIVE mg/dL
Leukocytes, UA: NEGATIVE
NITRITE: NEGATIVE
PH: 7.5 (ref 5.0–8.0)
PROTEIN: 30 mg/dL — AB
Specific Gravity, Urine: 1.02 (ref 1.005–1.030)
Urobilinogen, UA: 1 mg/dL (ref 0.0–1.0)

## 2016-01-09 MED ORDER — PRENATAL 27-0.8 MG PO TABS: 1.0000 | ORAL_TABLET | Freq: Every day | ORAL | Status: DC

## 2016-01-09 NOTE — Progress Notes (Signed)
Subjective:  Robin [name] is a 25 y.o. G3P1011 at 4520w6d being seen today for ongoing prenatal care.  She is currently monitored for the following issues for this low-risk pregnancy and has Supervision of normal subsequent pregnancy on her problem list.  Patient reports no complaints.  Contractions: Not present. Vag. Bleeding: None.  Movement: Present. Denies leaking of fluid.   The following portions of the patient's history were reviewed and updated as appropriate: allergies, current medications, past family history, past medical history, past social history, past surgical history and problem list. Problem list updated.  Objective:   Filed Vitals:   01/09/16 1057  BP: 132/84  Pulse: 91  Weight: 292 lb (132.45 kg)    Fetal Status: Fetal Heart Rate (bpm): 150 Fundal Height: 22 cm Movement: Present     General:  Alert, oriented and cooperative. Patient is in no acute distress.  Skin: Skin is warm and dry. No rash noted.   Cardiovascular: Normal heart rate noted  Respiratory: Normal respiratory effort, no problems with respiration noted  Abdomen: Soft, gravid, appropriate for gestational age. Pain/Pressure: Present     Pelvic: Vag. Bleeding: None     Cervical exam deferred        Extremities: Normal range of motion.  Edema: Trace  Mental Status: Normal mood and affect. Normal behavior. Normal judgment and thought content.   Urinalysis:      Assessment and Plan:  Pregnancy: G3P1011 at 7320w6d  1. Supervision of normal pregnancy in second trimester  - Prenatal Vit-Fe Fumarate-FA (MULTIVITAMIN-PRENATAL) 27-0.8 MG TABS tablet; Take 1 tablet by mouth daily at 12 noon.  Dispense: 30 each; Refill: 12  2. Abnormal weight gain during pregnancy, antepartum --Weight documented at previous visit inaccurate per pt.  Discussed prepregnancy weight with pt and adjusted to 230 lbs, making total weight gain >60 lbs by 21 weeks. Pt believes this is accurate. Discussed recommended weight gain in  pregnancy as 10-15 lbs for obese pts.  Risks of excessive weight gain to mother and baby discussed.  Recommend healthy diet, walking daily.  Pt states understanding.    3. Dependent edema --Recommend support socks/stockings.  Pt to get at pharmacy today.  Preterm labor symptoms and general obstetric precautions including but not limited to vaginal bleeding, contractions, leaking of fluid and fetal movement were reviewed in detail with the patient. Please refer to After Visit Summary for other counseling recommendations.  Return in about 4 weeks (around 02/06/2016).   Hurshel PartyLisa A Leftwich-Kirby, CNM

## 2016-01-09 NOTE — Patient Instructions (Signed)

## 2016-02-06 ENCOUNTER — Ambulatory Visit (INDEPENDENT_AMBULATORY_CARE_PROVIDER_SITE_OTHER): Payer: BLUE CROSS/BLUE SHIELD | Admitting: Family

## 2016-02-06 ENCOUNTER — Encounter: Payer: Self-pay | Admitting: Student

## 2016-02-06 VITALS — BP 126/77 | HR 86 | Wt 291.9 lb

## 2016-02-06 DIAGNOSIS — Z3482 Encounter for supervision of other normal pregnancy, second trimester: Secondary | ICD-10-CM

## 2016-02-06 DIAGNOSIS — R12 Heartburn: Secondary | ICD-10-CM

## 2016-02-06 DIAGNOSIS — O26892 Other specified pregnancy related conditions, second trimester: Secondary | ICD-10-CM

## 2016-02-06 LAB — POCT URINALYSIS DIP (DEVICE)
BILIRUBIN URINE: NEGATIVE
GLUCOSE, UA: NEGATIVE mg/dL
KETONES UR: NEGATIVE mg/dL
LEUKOCYTES UA: NEGATIVE
NITRITE: NEGATIVE
PH: 6 (ref 5.0–8.0)
Protein, ur: NEGATIVE mg/dL
Specific Gravity, Urine: 1.02 (ref 1.005–1.030)
Urobilinogen, UA: 0.2 mg/dL (ref 0.0–1.0)

## 2016-02-06 MED ORDER — RANITIDINE HCL 150 MG PO TABS: 150.0000 mg | ORAL_TABLET | Freq: Two times a day (BID) | ORAL | Status: DC

## 2016-02-06 NOTE — Progress Notes (Signed)
Subjective:  Robin ApleyCeirra [name] is a 25 y.o. G3P1011 at 6027w6d being seen today for ongoing prenatal care.  She is currently monitored for the following issues for this low-risk pregnancy and has Supervision of normal subsequent pregnancy on her problem list.  Patient reports no complaints.  Contractions: Irritability. Vag. Bleeding: None.  Movement: Present. Denies leaking of fluid.   The following portions of the patient's history were reviewed and updated as appropriate: allergies, current medications, past family history, past medical history, past social history, past surgical history and problem list. Problem list updated.  Objective:   Filed Vitals:   02/06/16 1040  BP: 126/77  Pulse: 86  Weight: 291 lb 14.4 oz (132.405 kg)    Fetal Status: Fetal Heart Rate (bpm): 150 Fundal Height: 28 cm Movement: Present     General:  Alert, oriented and cooperative. Patient is in no acute distress.  Skin: Skin is warm and dry. No rash noted.   Cardiovascular: Normal heart rate noted  Respiratory: Normal respiratory effort, no problems with respiration noted  Abdomen: Soft, gravid, appropriate for gestational age. Pain/Pressure: Present     Pelvic: Vag. Bleeding: None     Cervical exam deferred        Extremities: Normal range of motion.  Edema: Trace  Mental Status: Normal mood and affect. Normal behavior. Normal judgment and thought content.   Urinalysis: Urine Protein: Negative Urine Glucose: Negative  Assessment and Plan:  Pregnancy: G3P1011 at 3327w6d  1. Heartburn during pregnancy in second trimester, antepartum - ranitidine (ZANTAC) 150 MG tablet; Take 1 tablet (150 mg total) by mouth 2 (two) times daily.  Dispense: 60 tablet; Refill: 0  2. Supervision of normal pregnancy, antepartum, second trimester - US MFM OB FOLLOW UP; Future > reassess anatomy  3. Encounter for supervision of other normal pregnancy in second trimester - Discussed third trimester screening for next  visit  Preterm labor symptoms and general obstetric precautions including but not limited to vaginal bleeding, contractions, leaking of fluid and fetal movement were reviewed in detail with the patient. Please refer to After Visit Summary for other counseling recommendations.  Return in about 3 weeks (around 02/27/2016).   Eino FarberWalidah Kennith GainN Karim, CNM

## 2016-02-20 ENCOUNTER — Ambulatory Visit (HOSPITAL_COMMUNITY)
Admission: RE | Admit: 2016-02-20 | Discharge: 2016-02-20 | Disposition: A | Discharge status: Home / Self Care | Payer: BLUE CROSS/BLUE SHIELD | Source: Ambulatory Visit | Attending: Family | Admitting: Family

## 2016-02-20 ENCOUNTER — Other Ambulatory Visit: Payer: Self-pay | Admitting: Family

## 2016-02-20 DIAGNOSIS — O99332 Smoking (tobacco) complicating pregnancy, second trimester: Secondary | ICD-10-CM | POA: Diagnosis not present

## 2016-02-20 DIAGNOSIS — O99212 Obesity complicating pregnancy, second trimester: Secondary | ICD-10-CM | POA: Insufficient documentation

## 2016-02-20 DIAGNOSIS — Z3A27 27 weeks gestation of pregnancy: Secondary | ICD-10-CM | POA: Diagnosis not present

## 2016-02-20 DIAGNOSIS — J45909 Unspecified asthma, uncomplicated: Secondary | ICD-10-CM | POA: Diagnosis not present

## 2016-02-20 DIAGNOSIS — Z36 Encounter for antenatal screening of mother: Secondary | ICD-10-CM | POA: Insufficient documentation

## 2016-02-20 DIAGNOSIS — Z3482 Encounter for supervision of other normal pregnancy, second trimester: Secondary | ICD-10-CM

## 2016-02-20 DIAGNOSIS — O4100X0 Oligohydramnios, unspecified trimester, not applicable or unspecified: Secondary | ICD-10-CM

## 2016-02-20 DIAGNOSIS — O99512 Diseases of the respiratory system complicating pregnancy, second trimester: Secondary | ICD-10-CM | POA: Diagnosis not present

## 2016-02-20 DIAGNOSIS — O0993 Supervision of high risk pregnancy, unspecified, third trimester: Secondary | ICD-10-CM

## 2016-02-20 DIAGNOSIS — O4103X1 Oligohydramnios, third trimester, fetus 1: Secondary | ICD-10-CM

## 2016-02-21 DIAGNOSIS — O4100X0 Oligohydramnios, unspecified trimester, not applicable or unspecified: Secondary | ICD-10-CM | POA: Insufficient documentation

## 2016-02-22 ENCOUNTER — Inpatient Hospital Stay (HOSPITAL_COMMUNITY)
Admission: AD | Admit: 2016-02-22 | Discharge: 2016-02-22 | Disposition: A | Discharge status: Home / Self Care | Payer: BLUE CROSS/BLUE SHIELD | Source: Ambulatory Visit | Attending: Obstetrics & Gynecology | Admitting: Obstetrics & Gynecology

## 2016-02-22 ENCOUNTER — Encounter (HOSPITAL_COMMUNITY): Payer: Self-pay | Admitting: *Deleted

## 2016-02-22 DIAGNOSIS — Z3A28 28 weeks gestation of pregnancy: Secondary | ICD-10-CM | POA: Diagnosis not present

## 2016-02-22 DIAGNOSIS — O99213 Obesity complicating pregnancy, third trimester: Secondary | ICD-10-CM | POA: Diagnosis not present

## 2016-02-22 DIAGNOSIS — O289 Unspecified abnormal findings on antenatal screening of mother: Secondary | ICD-10-CM | POA: Diagnosis not present

## 2016-02-22 DIAGNOSIS — O26893 Other specified pregnancy related conditions, third trimester: Secondary | ICD-10-CM | POA: Insufficient documentation

## 2016-02-22 DIAGNOSIS — O99513 Diseases of the respiratory system complicating pregnancy, third trimester: Secondary | ICD-10-CM | POA: Insufficient documentation

## 2016-02-22 DIAGNOSIS — O4292 Full-term premature rupture of membranes, unspecified as to length of time between rupture and onset of labor: Secondary | ICD-10-CM | POA: Diagnosis present

## 2016-02-22 DIAGNOSIS — N898 Other specified noninflammatory disorders of vagina: Secondary | ICD-10-CM | POA: Insufficient documentation

## 2016-02-22 DIAGNOSIS — O99333 Smoking (tobacco) complicating pregnancy, third trimester: Secondary | ICD-10-CM | POA: Insufficient documentation

## 2016-02-22 DIAGNOSIS — O0993 Supervision of high risk pregnancy, unspecified, third trimester: Secondary | ICD-10-CM

## 2016-02-22 DIAGNOSIS — J45909 Unspecified asthma, uncomplicated: Secondary | ICD-10-CM | POA: Insufficient documentation

## 2016-02-22 DIAGNOSIS — O288 Other abnormal findings on antenatal screening of mother: Secondary | ICD-10-CM

## 2016-02-22 LAB — URINALYSIS, ROUTINE W REFLEX MICROSCOPIC
BILIRUBIN URINE: NEGATIVE
Glucose, UA: NEGATIVE mg/dL
Ketones, ur: NEGATIVE mg/dL
Leukocytes, UA: NEGATIVE
NITRITE: NEGATIVE
Protein, ur: NEGATIVE mg/dL
Specific Gravity, Urine: 1.025 (ref 1.005–1.030)
pH: 6 (ref 5.0–8.0)

## 2016-02-22 LAB — WET PREP, GENITAL
Clue Cells Wet Prep HPF POC: NONE SEEN
SPERM: NONE SEEN
Trich, Wet Prep: NONE SEEN
YEAST WET PREP: NONE SEEN

## 2016-02-22 LAB — URINE MICROSCOPIC-ADD ON

## 2016-02-22 NOTE — MAU Provider Note (Signed)
MAU HISTORY AND PHYSICAL  Chief Complaint:  Rupture of Membranes   Robin [name] is a 25 y.o.  G3P1011 with IUP at 5462w1d presenting for Rupture of Membranes .Pt was had routine ultrasound done and was told she had "low fluid and small size".  Today she comes to MAU b/c she is concerned about ultrasound results and possible ROM at ~10AM.  She states she has felt water trickling out since then.  She also reports feeling less fetal movement.  Patient states she has been having  none contractions, none vaginal bleeding,  with decreased  fetal movement (actually, just feels the baby moving lower in her abdomen than before).   Last intercourse: 24 hrs  Prenatal risk factors: morbid obesity, low AFI noted on last ultrasound, possible SGA, active smoker  Past Medical History  Diagnosis Date  . Asthma     Past Surgical History  Procedure Laterality Date  . No past surgeries      Family History  Problem Relation Age of Onset  . Cancer Maternal Aunt   . Cancer Paternal Grandmother     Social History  Substance Use Topics  . Smoking status: Current Every Day Smoker -- 1.00 packs/day    Types: Cigarettes  . Smokeless tobacco: Never Used  . Alcohol Use: No     Comment: occasionally not since pregnancy    No Known Allergies  No prescriptions prior to admission    Review of Systems - Negative except for what is mentioned in HPI.  Physical Exam  Blood pressure 129/77, pulse 83, temperature 98.6 F (37 C), temperature source Oral, resp. rate 20, height 5\' 2"  (1.575 m), weight 126.894 kg (279 lb 12 oz), last menstrual period 09/02/2015, unknown if currently breastfeeding. GENERAL: Well-developed, well-nourished female in no acute distress.  LUNGS: Clear to auscultation bilaterally.  HEART: Regular rate and rhythm. ABDOMEN: Soft, nontender, nondistended, gravid.  EXTREMITIES: Nontender, no edema, 2+ distal pulses. SSE;  Normal appearing discharge.  Negative pooling, valsalva, and  fern.  Nabothian cyst on posterior cervix noted.  Cervical Exam: Closed/ thick/posterior Presentation: vertex FHT:  Baseline 150, mod variability, +acels, no decels- reactive Contractions: None   Labs: Results for orders placed or performed during the hospital encounter of 02/22/16 (from the past 24 hour(s))  Urinalysis, Routine w reflex microscopic ([facility])   Collection Time: 02/22/16  7:43 PM  Result Value Ref Range   Color, Urine YELLOW YELLOW   APPearance CLEAR CLEAR   Specific Gravity, Urine 1.025 1.005 - 1.030   pH 6.0 5.0 - 8.0   Glucose, UA NEGATIVE NEGATIVE mg/dL   Hgb urine dipstick SMALL (A) NEGATIVE   Bilirubin Urine NEGATIVE NEGATIVE   Ketones, ur NEGATIVE NEGATIVE mg/dL   Protein, ur NEGATIVE NEGATIVE mg/dL   Nitrite NEGATIVE NEGATIVE   Leukocytes, UA NEGATIVE NEGATIVE  Urine microscopic-add on   Collection Time: 02/22/16  7:43 PM  Result Value Ref Range   Squamous Epithelial / LPF 0-5 (A) NONE SEEN   WBC, UA 0-5 0 - 5 WBC/hpf   RBC / HPF 0-5 0 - 5 RBC/hpf   Bacteria, UA RARE (A) NONE SEEN   Urine-Other MUCOUS PRESENT   Wet prep, genital   Collection Time: 02/22/16  8:46 PM  Result Value Ref Range   Yeast Wet Prep HPF POC NONE SEEN NONE SEEN   Trich, Wet Prep NONE SEEN NONE SEEN   Clue Cells Wet Prep HPF POC NONE SEEN NONE SEEN   WBC, Wet Prep HPF  POC FEW (A) NONE SEEN   Sperm NONE SEEN     Imaging Studies:  [facility] Ob Follow Up  02/20/2016  OBSTETRICAL ULTRASOUND: This exam was performed within a [facility]. The OB US report was generated in the AS system, and faxed to the ordering physician.  This report is available in the [facility] PACS. See the [facility] Obstetric US report via the Image Link.   Assessment: Robin ApleyCeirra [name] is  25 y.o. G3P1011 at 3658w1d presents without evidence of ROM.  Normal EFM  Plan: #False ROM -Discussed true pre-term labor precautions -Wet prep sent:  negative -UA wnl -SVE revealed normal vaginal  discharge -D/c to home once wet prep results returned.  F/u with OB provider.  CRESENZO-DISHMAN,Rhealyn Cullen 6/22/20179:30 PM

## 2016-02-22 NOTE — Discharge Instructions (Signed)
Premature Rupture and Preterm Premature Rupture of Membranes A sac made up of membranes surrounds your baby in the womb (uterus). When this sac breaks before contractions or labor starts, it is called premature rupture of membranes (PROM). Rupture of membranes is also known as your water breaking. If this happens before 37 weeks, it is called preterm premature rupture of membranes (PPROM). PPROM is serious. It needs medical care right away. CAUSES  PROM may be caused by the membranes getting weak. This happens at the end of pregnancy. PPROM is often due to an infection, but can be caused by a number of other things.  SIGNS OF PROM OR PPROM  A sudden gush of fluid from the vagina.  A slow leak of fluid from the vagina.  Your underwear stay wet. WHAT TO DO IF YOU THINK YOUR WATER BROKE Call your doctor right away. You will need to go to the hospital to get checked right away. WHAT HAPPENS IF YOU ARE TOLD YOU HAVE PROM OR PPROM? You will have tests done at the hospital. If you have PROM, you may be given medicine to start labor (induced). This may happen if you are not having contractions within 24 hours of your water breaking. If you have PPROM and are not having contractions, you may be given medicine to start labor. It will depend on how far along you are in your pregnancy. If you have PPROM, you:  And your baby will be watched closely for signs of infection or other problems.  May be given an antibiotic medicine. This can stop an infection from starting.  May be given a steroid medicine. This can help the lungs to develop faster.  May be given a medicine to stop early labor (preterm labor).  May be told to stay in bed except to use the restroom (bed rest).  May be given medicine to start labor. This can happen if there are problems with you or the baby. Your treatment will depend on many factors.   This information is not intended to replace advice given to you by your health care  provider. Make sure you discuss any questions you have with your health care provider.   Document Released: 11/15/2008 Document Revised: 04/21/2013 Document Reviewed: 12/08/2012 Elsevier Interactive Patient Education [employer] Elsevier Inc. Oligohydramnios An unborn baby (fetus) lives in the mother's uterus in a sac of amniotic fluid. This fluid:  Protects the fetus from trauma.  Helps the fetus move freely inside the uterus.  Helps the fetal lungs, kidneys, and digestive system develop.  Protects the baby from infections. Oligohydramnios is when there is not enough amniotic fluid in the amniotic sac. If this happens early in pregnancy, a fetus might not develop normally. If this happens in the second half of a pregnancy, a fetus might not grow as much as it should and could cause problems during delivery.  Oligohydramnios can also cause:  Pregnancy loss (miscarriage).  Premature birth.  Deformities of the face or body.  Problems with muscles and bones.  Pressure and compression on the umbilical cord, which decreases oxygen to the fetus.  Lung problems.  Stillbirth. CAUSES A cause cannot always be found.However, possible causes include:  A leak or a tear in the amniotic sac.  A problem with the placenta.  Having identical twins who share the same placenta.  A fetal birth defect. This is usually something in the fetal kidneys or urinary tract that has not developed as it should.  A pregnancy that goes  past the due date.  Something that affects the mother's body, such as:  Dehydration.  High blood pressure.  Diabetes.  Some medications (examples include ibuprofen and blood pressure medicines).  A disease that affects the skin, joints, kidneys, and other organs (systemic lupus).  Birth defects. SYMPTOMS  There may be no symptoms.  Leaking fluid from the vagina.  Measuring smaller than usual uterus at a routine pregnancy exam. DIAGNOSIS To diagnose and  evaluate oligohydramnios, your caregiver may:  Order a prenatal ultrasound test. This test:  Measures the amniotic fluid level. This will show if the amount of fluid is right for the stage of pregnancy.  Checks the fetal kidneys.  Checks fetal growth.  Evaluates the placenta.  Confirm that you broke your water, if this is suspected by your caregiver.  Order a nonstress test. This noninvasive test is an assessment of fetal well-being.It monitors the fetal heart rate patterns over a 20-minute period.  Order a biophysical profile. This test measures and evaluates 5 observations of the baby (results of nonstress testing, fetal breathing, movements, muscle tone, and amount of amniotic fluid).  Assess fetal kick counts. Tell your caregiver if the baby appears to become less active.  Order a uterine artery Doppler study. This is a type of ultrasound. It can show if enough blood and nourishment are getting to the fetus through the placenta and umbilical cord.  Check your blood pressure.  Check your blood sugar. TREATMENT Treatment will depend on how low the fluid is, how far along in the pregnancy you are, and your overall health. Treatment options include:  Watching and waiting. You will need to be checked more often than normal.  Increasing your fluid intake. This may be done by mouth, or you might get the fluids through the vein (intravenously, IV).  Delivering the baby if recommended by your caregiver. HOME CARE INSTRUCTIONS  Only take medicine as directed by your caregiver, especially if you have a medical problem (diabetes, high blood pressure).  Follow your caregiver's instructions regarding physical activity, especially if you have a medical problem (diabetes, high blood pressure).  Keep all prenatal care appointments.  Rest as much as possible. Your caregiver may put you on bed rest.  Drink enough fluids to keep your urine clear or pale yellow.  Eat a healthy and  nourishing diet.  Do not smoke, drink alcohol, or take illegal drugs. SEEK MEDICAL CARE IF:  You have any questions or worries about your pregnancy.  You notice a decrease in fetal movement. SEEK IMMEDIATE MEDICAL CARE IF:   Fluid comes out of your vagina.  You start to have labor pains (contractions).  You have a fever.   This information is not intended to replace advice given to you by your health care provider. Make sure you discuss any questions you have with your health care provider.   Document Released: 12/04/2010 Document Revised: 09/09/2014 Document Reviewed: 12/04/2010 Elsevier Interactive Patient Education [employer] Elsevier Inc.

## 2016-02-22 NOTE — MAU Note (Signed)
PT SAYS HER LAST APPOINTMENT   IN  CLINIC   WAS 6-6.    WAS SENT  TO MFM  FOR  AN U/S-  ON 6-20.      DR IN MFM -  AFTER U/S   - TOLD  HER FLUID  WAS LOW  AND  BABY  WAS  SMALL.         PT  SAYS     AT 1030  THIS   MORN   -  SHE WENT  TO    B-ROOM-    AND  THEN SAT ON BED -  AND FLUID  STARTED  COMING  OUT-   UNDERWEAR  WERE  WET .    FLUID  HAS CONTINUED  TO  RUN  OUT-  CLEAR.          HAS HAD CRAMPING  SINCE Tuesday -   NOT AS BAD  NOW.    LAST SEX- LAST NIGHT.         NOW IN TRIAGE - NO PAD , NO UNDERWEAR ON-   SAYS   SEAT OF PANTS ARE WET.        FEELS  BABY MOVING-  BUT LESS.

## 2016-02-28 ENCOUNTER — Ambulatory Visit (HOSPITAL_COMMUNITY)
Admission: RE | Admit: 2016-02-28 | Discharge: 2016-02-28 | Disposition: A | Discharge status: Home / Self Care | Payer: BLUE CROSS/BLUE SHIELD | Source: Ambulatory Visit | Attending: Obstetrics and Gynecology | Admitting: Obstetrics and Gynecology

## 2016-02-28 ENCOUNTER — Ambulatory Visit (INDEPENDENT_AMBULATORY_CARE_PROVIDER_SITE_OTHER): Payer: BLUE CROSS/BLUE SHIELD | Admitting: Obstetrics and Gynecology

## 2016-02-28 ENCOUNTER — Encounter (HOSPITAL_COMMUNITY): Payer: Self-pay

## 2016-02-28 DIAGNOSIS — Z3A29 29 weeks gestation of pregnancy: Secondary | ICD-10-CM | POA: Diagnosis not present

## 2016-02-28 DIAGNOSIS — O99213 Obesity complicating pregnancy, third trimester: Secondary | ICD-10-CM | POA: Insufficient documentation

## 2016-02-28 DIAGNOSIS — O9989 Other specified diseases and conditions complicating pregnancy, childbirth and the puerperium: Secondary | ICD-10-CM | POA: Diagnosis not present

## 2016-02-28 DIAGNOSIS — O4103X0 Oligohydramnios, third trimester, not applicable or unspecified: Secondary | ICD-10-CM | POA: Insufficient documentation

## 2016-02-28 DIAGNOSIS — O99333 Smoking (tobacco) complicating pregnancy, third trimester: Secondary | ICD-10-CM | POA: Insufficient documentation

## 2016-02-28 DIAGNOSIS — J45909 Unspecified asthma, uncomplicated: Secondary | ICD-10-CM | POA: Diagnosis not present

## 2016-02-28 DIAGNOSIS — O4103X1 Oligohydramnios, third trimester, fetus 1: Secondary | ICD-10-CM

## 2016-02-29 ENCOUNTER — Other Ambulatory Visit (HOSPITAL_COMMUNITY): Payer: Self-pay | Admitting: *Deleted

## 2016-02-29 ENCOUNTER — Ambulatory Visit (INDEPENDENT_AMBULATORY_CARE_PROVIDER_SITE_OTHER): Payer: BLUE CROSS/BLUE SHIELD | Admitting: Obstetrics and Gynecology

## 2016-02-29 VITALS — BP 125/82 | HR 88 | Wt 301.8 lb

## 2016-02-29 DIAGNOSIS — O98319 Other infections with a predominantly sexual mode of transmission complicating pregnancy, unspecified trimester: Secondary | ICD-10-CM

## 2016-02-29 DIAGNOSIS — O98819 Other maternal infectious and parasitic diseases complicating pregnancy, unspecified trimester: Secondary | ICD-10-CM

## 2016-02-29 DIAGNOSIS — A749 Chlamydial infection, unspecified: Secondary | ICD-10-CM

## 2016-02-29 DIAGNOSIS — O99213 Obesity complicating pregnancy, third trimester: Secondary | ICD-10-CM | POA: Insufficient documentation

## 2016-02-29 DIAGNOSIS — E669 Obesity, unspecified: Secondary | ICD-10-CM

## 2016-02-29 DIAGNOSIS — O0993 Supervision of high risk pregnancy, unspecified, third trimester: Secondary | ICD-10-CM

## 2016-02-29 HISTORY — DX: Chlamydial infection, unspecified: A74.9

## 2016-02-29 LAB — POCT URINALYSIS DIP (DEVICE)
Bilirubin Urine: NEGATIVE
GLUCOSE, UA: NEGATIVE mg/dL
KETONES UR: NEGATIVE mg/dL
Leukocytes, UA: NEGATIVE
Nitrite: NEGATIVE
PROTEIN: 30 mg/dL — AB
SPECIFIC GRAVITY, URINE: 1.025 (ref 1.005–1.030)
Urobilinogen, UA: 0.2 mg/dL (ref 0.0–1.0)
pH: 6 (ref 5.0–8.0)

## 2016-02-29 LAB — CBC
HEMATOCRIT: 37.1 % (ref 35.0–45.0)
Hemoglobin: 12 g/dL (ref 11.7–15.5)
MCH: 27.5 pg (ref 27.0–33.0)
MCHC: 32.3 g/dL (ref 32.0–36.0)
MCV: 84.9 fL (ref 80.0–100.0)
MPV: 9.1 fL (ref 7.5–12.5)
Platelets: 278 10*3/uL (ref 140–400)
RBC: 4.37 MIL/uL (ref 3.80–5.10)
RDW: 14.6 % (ref 11.0–15.0)
WBC: 9.8 10*3/uL (ref 3.8–10.8)

## 2016-02-29 MED ORDER — TETANUS-DIPHTH-ACELL PERTUSSIS 5-2.5-18.5 LF-MCG/0.5 IM SUSP
0.5000 mL | Freq: Once | INTRAMUSCULAR | Status: AC
  Administered 2016-02-29: 0.5 mL via INTRAMUSCULAR

## 2016-02-29 NOTE — Progress Notes (Signed)
Subjective:  Robin ApleyCeirra [name] is a 25 y.o. G3P1011 at 22106w1d being seen today for ongoing prenatal care.  She is currently monitored for the following issues for this high-risk pregnancy and has Supervision of high risk pregnancy, antepartum; Oligohydramnios antepartum; Obesity affecting pregnancy in third trimester, antepartum; and Chlamydia infection affecting pregnancy on her problem list.  Patient reports no complaints.   Contractions: Irritability. Vag. Bleeding: None.  Movement: Present. Denies leaking of fluid.   The following portions of the patient's history were reviewed and updated as appropriate: allergies, current medications, past family history, past medical history, past social history, past surgical history and problem list. Problem list updated.  Objective:   Filed Vitals:   02/29/16 0957  BP: 125/82  Pulse: 88  Weight: 301 lb 12.8 oz (136.896 kg)    Fetal Status: Fetal Heart Rate (bpm): 148   Movement: Present     General:  Alert, oriented and cooperative. Patient is in no acute distress.  Skin: Skin is warm and dry. No rash noted.   Cardiovascular: Normal heart rate noted  Respiratory: Normal respiratory effort, no problems with respiration noted  Abdomen: Soft, gravid, appropriate for gestational age. Pain/Pressure: Present     Pelvic:  Cervical exam deferred        Extremities: Normal range of motion.  Edema: Trace  Mental Status: Normal mood and affect. Normal behavior. Normal judgment and thought content.   Urinalysis:      Assessment and Plan:  Pregnancy: G3P1011 at 80106w1d  1. Supervision of high risk pregnancy, antepartum, third trimester Routine care. 28wk labs and tdap today  2. Obesity affecting pregnancy in third trimester, antepartum BMI 55 from 42. D/w pt that I recommend no more weight gain for rest of pregnancy. Patient amenable to see nutrition and was seen by Vernona RiegerLaura today. Follow up 1hr GCT  3. Chlamydia infection affecting pregnancy Negative  TOC in April  4. H/o oligo and borderline FGR Followed by MFM Normal AFI (low normal) yesterday Follow up qwk surveillance with MFM next week and rpt growth in 2wks. FKC precautions given  Preterm labor symptoms and general obstetric precautions including but not limited to vaginal bleeding, contractions, leaking of fluid and fetal movement were reviewed in detail with the patient. Please refer to After Visit Summary for other counseling recommendations.  Return in about 2 weeks (around 03/14/2016).   Robin Bingharlie Kasha Howeth, MD

## 2016-02-29 NOTE — Progress Notes (Signed)
Nutrition note: consult for wt gain  Pt has h/o obesity.  Pt has gained 31.8# @ 3232w1d, which is > expected; pt gained ~10# from 6/6-6/29/17 (recommended is 0.5#/wk).  Pt reports eating 4 meals & 3-4 snacks/d. Pt is taking a PNV. Pt reports she has nausea & heartburn.  Pt reports walking ~20 mins most days. Pt received verbal & written education on general nutrition during pregnancy. Encouraged pt to keep a food diary for a few days & compare to recommended number of servings/d of each food group. Provided handout with tips for heartburn.  Discussed wt gain goals of 11-20# or 0.5#/wk. Pt agrees to monitor her portion sizes. Pt has WIC. F/u as needed Blondell RevealLaura Benoit Meech, MS, RD, LDN, [facility]

## 2016-02-29 NOTE — Progress Notes (Signed)
28 week labs today. 1 hr due at 1058.

## 2016-02-29 NOTE — Progress Notes (Deleted)
Nutrition note: consult for wt gain Pt has h/o obesity.  Pt has gained 31.8# @ 8472w1d, which is > expected; pt gained ~10# from 6/6-6/29/17 (recommended is 0.5#/wk).  Pt reports eating 4 meals & 3-4 snacks/d. Pt is taking a PNV. Pt reports having some nausea & heartburn. Pt reports walking for ~20 mins most days. Pt received verbal & written education on general nutrition during pregnancy. Discussed how many

## 2016-03-01 LAB — RPR

## 2016-03-01 LAB — HIV ANTIBODY (ROUTINE TESTING W REFLEX): HIV 1&2 Ab, 4th Generation: NONREACTIVE

## 2016-03-01 LAB — GLUCOSE TOLERANCE, 1 HOUR (50G) W/O FASTING: Glucose, 1 Hr, gestational: 127 mg/dL (ref <140)

## 2016-03-06 ENCOUNTER — Ambulatory Visit (HOSPITAL_COMMUNITY)
Admission: RE | Admit: 2016-03-06 | Discharge: 2016-03-06 | Disposition: A | Discharge status: Home / Self Care | Payer: Medicaid Other | Source: Ambulatory Visit | Attending: Obstetrics and Gynecology | Admitting: Obstetrics and Gynecology

## 2016-03-06 ENCOUNTER — Encounter (HOSPITAL_COMMUNITY): Payer: Self-pay

## 2016-03-06 DIAGNOSIS — O99333 Smoking (tobacco) complicating pregnancy, third trimester: Secondary | ICD-10-CM | POA: Diagnosis not present

## 2016-03-06 DIAGNOSIS — J45909 Unspecified asthma, uncomplicated: Secondary | ICD-10-CM | POA: Insufficient documentation

## 2016-03-06 DIAGNOSIS — Z3A30 30 weeks gestation of pregnancy: Secondary | ICD-10-CM | POA: Insufficient documentation

## 2016-03-06 DIAGNOSIS — O99212 Obesity complicating pregnancy, second trimester: Secondary | ICD-10-CM | POA: Diagnosis not present

## 2016-03-06 DIAGNOSIS — O365930 Maternal care for other known or suspected poor fetal growth, third trimester, not applicable or unspecified: Secondary | ICD-10-CM | POA: Diagnosis not present

## 2016-03-06 DIAGNOSIS — O9989 Other specified diseases and conditions complicating pregnancy, childbirth and the puerperium: Secondary | ICD-10-CM | POA: Insufficient documentation

## 2016-03-12 ENCOUNTER — Ambulatory Visit (HOSPITAL_COMMUNITY)
Admission: RE | Admit: 2016-03-12 | Discharge: 2016-03-12 | Disposition: A | Discharge status: Home / Self Care | Payer: Medicaid Other | Source: Ambulatory Visit | Attending: Family | Admitting: Family

## 2016-03-12 ENCOUNTER — Encounter (HOSPITAL_COMMUNITY): Payer: Self-pay

## 2016-03-12 ENCOUNTER — Other Ambulatory Visit (HOSPITAL_COMMUNITY): Payer: Self-pay | Admitting: Obstetrics and Gynecology

## 2016-03-12 VITALS — BP 126/85 | HR 74 | Wt 303.5 lb

## 2016-03-12 DIAGNOSIS — Z3A30 30 weeks gestation of pregnancy: Secondary | ICD-10-CM

## 2016-03-12 DIAGNOSIS — O99213 Obesity complicating pregnancy, third trimester: Secondary | ICD-10-CM | POA: Diagnosis present

## 2016-03-12 DIAGNOSIS — O365930 Maternal care for other known or suspected poor fetal growth, third trimester, not applicable or unspecified: Secondary | ICD-10-CM | POA: Insufficient documentation

## 2016-03-12 DIAGNOSIS — O4103X0 Oligohydramnios, third trimester, not applicable or unspecified: Secondary | ICD-10-CM | POA: Diagnosis not present

## 2016-03-12 DIAGNOSIS — O99333 Smoking (tobacco) complicating pregnancy, third trimester: Secondary | ICD-10-CM | POA: Insufficient documentation

## 2016-03-12 DIAGNOSIS — O4100X0 Oligohydramnios, unspecified trimester, not applicable or unspecified: Secondary | ICD-10-CM

## 2016-03-12 DIAGNOSIS — O99513 Diseases of the respiratory system complicating pregnancy, third trimester: Secondary | ICD-10-CM | POA: Insufficient documentation

## 2016-03-12 DIAGNOSIS — J45909 Unspecified asthma, uncomplicated: Secondary | ICD-10-CM | POA: Insufficient documentation

## 2016-03-12 DIAGNOSIS — O0990 Supervision of high risk pregnancy, unspecified, unspecified trimester: Secondary | ICD-10-CM

## 2016-03-12 MED ORDER — BETAMETHASONE SOD PHOS & ACET 6 (3-3) MG/ML IJ SUSP
12.0000 mg | Freq: Once | INTRAMUSCULAR | Status: AC
Start: 2016-03-12 — End: 2016-03-12
  Administered 2016-03-12: 12 mg via INTRAMUSCULAR
  Filled 2016-03-12: qty 2

## 2016-03-12 MED ORDER — BETAMETHASONE SOD PHOS & ACET 6 (3-3) MG/ML IJ SUSP: 12.0000 mg | Freq: Once | INTRAMUSCULAR | Status: DC

## 2016-03-13 ENCOUNTER — Ambulatory Visit (HOSPITAL_COMMUNITY)
Admission: RE | Admit: 2016-03-13 | Discharge: 2016-03-13 | Disposition: A | Discharge status: Home / Self Care | Payer: Medicaid Other | Source: Ambulatory Visit | Attending: Family | Admitting: Family

## 2016-03-13 ENCOUNTER — Other Ambulatory Visit (HOSPITAL_COMMUNITY): Payer: Self-pay | Admitting: *Deleted

## 2016-03-13 DIAGNOSIS — O365930 Maternal care for other known or suspected poor fetal growth, third trimester, not applicable or unspecified: Secondary | ICD-10-CM | POA: Diagnosis not present

## 2016-03-13 DIAGNOSIS — O4103X0 Oligohydramnios, third trimester, not applicable or unspecified: Secondary | ICD-10-CM | POA: Diagnosis not present

## 2016-03-13 DIAGNOSIS — Z3A31 31 weeks gestation of pregnancy: Secondary | ICD-10-CM | POA: Diagnosis not present

## 2016-03-13 DIAGNOSIS — IMO0002 Reserved for concepts with insufficient information to code with codable children: Secondary | ICD-10-CM

## 2016-03-13 MED ORDER — BETAMETHASONE SOD PHOS & ACET 6 (3-3) MG/ML IJ SUSP
12.0000 mg | Freq: Once | INTRAMUSCULAR | Status: AC
  Administered 2016-03-13: 12 mg via INTRAMUSCULAR
  Filled 2016-03-13: qty 2

## 2016-03-14 ENCOUNTER — Ambulatory Visit (INDEPENDENT_AMBULATORY_CARE_PROVIDER_SITE_OTHER): Payer: Medicaid Other | Admitting: Family Medicine

## 2016-03-14 VITALS — BP 144/81 | HR 62 | Wt 302.1 lb

## 2016-03-14 DIAGNOSIS — O4100X0 Oligohydramnios, unspecified trimester, not applicable or unspecified: Secondary | ICD-10-CM

## 2016-03-14 DIAGNOSIS — O4103X0 Oligohydramnios, third trimester, not applicable or unspecified: Secondary | ICD-10-CM | POA: Diagnosis present

## 2016-03-14 DIAGNOSIS — O365990 Maternal care for other known or suspected poor fetal growth, unspecified trimester, not applicable or unspecified: Secondary | ICD-10-CM | POA: Insufficient documentation

## 2016-03-14 DIAGNOSIS — R030 Elevated blood-pressure reading, without diagnosis of hypertension: Secondary | ICD-10-CM | POA: Diagnosis not present

## 2016-03-14 DIAGNOSIS — IMO0001 Reserved for inherently not codable concepts without codable children: Secondary | ICD-10-CM

## 2016-03-14 DIAGNOSIS — O0990 Supervision of high risk pregnancy, unspecified, unspecified trimester: Secondary | ICD-10-CM

## 2016-03-14 DIAGNOSIS — O365930 Maternal care for other known or suspected poor fetal growth, third trimester, not applicable or unspecified: Secondary | ICD-10-CM | POA: Diagnosis not present

## 2016-03-14 LAB — POCT URINALYSIS DIP (DEVICE)
Bilirubin Urine: NEGATIVE
GLUCOSE, UA: NEGATIVE mg/dL
Ketones, ur: NEGATIVE mg/dL
NITRITE: NEGATIVE
Protein, ur: 100 mg/dL — AB
Specific Gravity, Urine: >=1.03 (ref 1.005–1.030)
UROBILINOGEN UA: 0.2 mg/dL (ref 0.0–1.0)
pH: 6 (ref 5.0–8.0)

## 2016-03-14 NOTE — Progress Notes (Signed)
Urine: mod blood, small leukocytes

## 2016-03-14 NOTE — Progress Notes (Signed)
Subjective:  Burton ApleyCeirra Ballo is a 25 y.o. G3P1011 at 6755w1d being seen today for ongoing prenatal care.  She is currently monitored for the following issues for this high-risk pregnancy and has Supervision of high risk pregnancy, antepartum; Oligohydramnios antepartum; Obesity affecting pregnancy in third trimester, antepartum; Chlamydia infection affecting pregnancy; and IUGR (intrauterine growth restriction) affecting care of mother on her problem list.  Patient reports occasional contractions.  Contractions: Irregular. Vag. Bleeding: None.  Movement: Present. Denies leaking of fluid.   The following portions of the patient's history were reviewed and updated as appropriate: allergies, current medications, past family history, past medical history, past social history, past surgical history and problem list. Problem list updated.  Objective:   Filed Vitals:   03/14/16 0752  BP: 144/81  Pulse: 62  Weight: 302 lb 1.6 oz (137.032 kg)    Fetal Status: Fetal Heart Rate (bpm): 144   Movement: Present     General:  Alert, oriented and cooperative. Patient is in no acute distress.  Skin: Skin is warm and dry. No rash noted.   Cardiovascular: Normal heart rate noted  Respiratory: Normal respiratory effort, no problems with respiration noted  Abdomen: Soft, gravid, appropriate for gestational age. Pain/Pressure: Present     Pelvic:  Cervical exam performed Dilation: Closed Effacement (%): Thick    Extremities: Normal range of motion.  Edema: Trace  Mental Status: Normal mood and affect. Normal behavior. Normal judgment and thought content.   Urinalysis: Urine Protein: 2+ Urine Glucose: Negative  Assessment and Plan:  Pregnancy: G3P1011 at 5155w1d  1. Supervision of high risk pregnancy, antepartum, unspecified trimester Normal FHT, FH.    2. Oligohydramnios antepartum, unspecified trimester, not applicable or unspecified fetus Improved  3. IUGR (intrauterine growth restriction) affecting  care of mother, third trimester, not applicable or unspecified fetus Pt having BPP weekly.  4. Elevated BP Will Follow BP closely.  Pt has appt with MFM early next week - if elevated again, will need labs.  I called them and asked them to watch her BP.  Preterm labor symptoms and general obstetric precautions including but not limited to vaginal bleeding, contractions, leaking of fluid and fetal movement were reviewed in detail with the patient. Please refer to After Visit Summary for other counseling recommendations.  No Follow-up on file.   Levie HeritageJacob J Stinson, DO

## 2016-03-19 ENCOUNTER — Ambulatory Visit (HOSPITAL_COMMUNITY)
Admission: RE | Admit: 2016-03-19 | Discharge: 2016-03-19 | Disposition: A | Discharge status: Home / Self Care | Payer: Medicaid Other | Source: Ambulatory Visit | Attending: Family | Admitting: Family

## 2016-03-19 ENCOUNTER — Encounter (HOSPITAL_COMMUNITY): Payer: Self-pay

## 2016-03-19 ENCOUNTER — Other Ambulatory Visit (HOSPITAL_COMMUNITY): Payer: Self-pay | Admitting: Maternal and Fetal Medicine

## 2016-03-19 VITALS — BP 125/76 | HR 70 | Wt 309.2 lb

## 2016-03-19 DIAGNOSIS — Z3A31 31 weeks gestation of pregnancy: Secondary | ICD-10-CM

## 2016-03-19 DIAGNOSIS — J45909 Unspecified asthma, uncomplicated: Secondary | ICD-10-CM | POA: Diagnosis not present

## 2016-03-19 DIAGNOSIS — IMO0002 Reserved for concepts with insufficient information to code with codable children: Secondary | ICD-10-CM

## 2016-03-19 DIAGNOSIS — O99213 Obesity complicating pregnancy, third trimester: Secondary | ICD-10-CM | POA: Insufficient documentation

## 2016-03-19 DIAGNOSIS — O99513 Diseases of the respiratory system complicating pregnancy, third trimester: Secondary | ICD-10-CM | POA: Insufficient documentation

## 2016-03-19 DIAGNOSIS — O99333 Smoking (tobacco) complicating pregnancy, third trimester: Secondary | ICD-10-CM | POA: Diagnosis not present

## 2016-03-19 DIAGNOSIS — O4100X0 Oligohydramnios, unspecified trimester, not applicable or unspecified: Secondary | ICD-10-CM

## 2016-03-19 DIAGNOSIS — O365930 Maternal care for other known or suspected poor fetal growth, third trimester, not applicable or unspecified: Secondary | ICD-10-CM | POA: Diagnosis not present

## 2016-03-19 DIAGNOSIS — O0990 Supervision of high risk pregnancy, unspecified, unspecified trimester: Secondary | ICD-10-CM

## 2016-03-19 DIAGNOSIS — O4103X0 Oligohydramnios, third trimester, not applicable or unspecified: Secondary | ICD-10-CM

## 2016-03-26 ENCOUNTER — Ambulatory Visit (HOSPITAL_COMMUNITY): Admission: RE | Admit: 2016-03-26 | Payer: Medicaid Other | Source: Ambulatory Visit

## 2016-03-26 ENCOUNTER — Encounter (HOSPITAL_COMMUNITY): Payer: Self-pay

## 2016-03-27 ENCOUNTER — Encounter (HOSPITAL_COMMUNITY): Payer: Self-pay

## 2016-03-27 ENCOUNTER — Ambulatory Visit (HOSPITAL_COMMUNITY)
Admission: RE | Admit: 2016-03-27 | Discharge: 2016-03-27 | Disposition: A | Discharge status: Home / Self Care | Payer: Medicaid Other | Source: Ambulatory Visit | Attending: Obstetrics & Gynecology | Admitting: Obstetrics & Gynecology

## 2016-03-27 DIAGNOSIS — O99513 Diseases of the respiratory system complicating pregnancy, third trimester: Secondary | ICD-10-CM | POA: Insufficient documentation

## 2016-03-27 DIAGNOSIS — O0990 Supervision of high risk pregnancy, unspecified, unspecified trimester: Secondary | ICD-10-CM

## 2016-03-27 DIAGNOSIS — O99213 Obesity complicating pregnancy, third trimester: Secondary | ICD-10-CM | POA: Insufficient documentation

## 2016-03-27 DIAGNOSIS — O4100X0 Oligohydramnios, unspecified trimester, not applicable or unspecified: Secondary | ICD-10-CM | POA: Diagnosis not present

## 2016-03-27 DIAGNOSIS — Z3A33 33 weeks gestation of pregnancy: Secondary | ICD-10-CM | POA: Insufficient documentation

## 2016-03-27 DIAGNOSIS — O365930 Maternal care for other known or suspected poor fetal growth, third trimester, not applicable or unspecified: Secondary | ICD-10-CM | POA: Insufficient documentation

## 2016-03-27 DIAGNOSIS — O99333 Smoking (tobacco) complicating pregnancy, third trimester: Secondary | ICD-10-CM | POA: Diagnosis not present

## 2016-03-27 DIAGNOSIS — IMO0002 Reserved for concepts with insufficient information to code with codable children: Secondary | ICD-10-CM

## 2016-03-27 DIAGNOSIS — J45909 Unspecified asthma, uncomplicated: Secondary | ICD-10-CM | POA: Insufficient documentation

## 2016-03-28 ENCOUNTER — Other Ambulatory Visit (HOSPITAL_COMMUNITY): Payer: Self-pay

## 2016-03-28 DIAGNOSIS — O365990 Maternal care for other known or suspected poor fetal growth, unspecified trimester, not applicable or unspecified: Secondary | ICD-10-CM

## 2016-03-29 ENCOUNTER — Encounter (HOSPITAL_COMMUNITY): Payer: Self-pay | Admitting: *Deleted

## 2016-03-29 ENCOUNTER — Encounter (HOSPITAL_COMMUNITY): Payer: Self-pay

## 2016-03-29 ENCOUNTER — Inpatient Hospital Stay (HOSPITAL_COMMUNITY)
Admission: AD | Admit: 2016-03-29 | Discharge: 2016-03-29 | Disposition: A | Discharge status: Home / Self Care | Payer: Medicaid Other | Source: Ambulatory Visit | Attending: Obstetrics & Gynecology | Admitting: Obstetrics & Gynecology

## 2016-03-29 ENCOUNTER — Ambulatory Visit (HOSPITAL_COMMUNITY)
Admission: RE | Admit: 2016-03-29 | Discharge: 2016-03-29 | Disposition: A | Discharge status: Home / Self Care | Payer: Medicaid Other | Source: Ambulatory Visit | Attending: Obstetrics & Gynecology | Admitting: Obstetrics & Gynecology

## 2016-03-29 DIAGNOSIS — O365990 Maternal care for other known or suspected poor fetal growth, unspecified trimester, not applicable or unspecified: Secondary | ICD-10-CM

## 2016-03-29 DIAGNOSIS — E669 Obesity, unspecified: Secondary | ICD-10-CM | POA: Insufficient documentation

## 2016-03-29 DIAGNOSIS — O42913 Preterm premature rupture of membranes, unspecified as to length of time between rupture and onset of labor, third trimester: Secondary | ICD-10-CM | POA: Insufficient documentation

## 2016-03-29 DIAGNOSIS — Z3A33 33 weeks gestation of pregnancy: Secondary | ICD-10-CM | POA: Insufficient documentation

## 2016-03-29 DIAGNOSIS — O0990 Supervision of high risk pregnancy, unspecified, unspecified trimester: Secondary | ICD-10-CM

## 2016-03-29 DIAGNOSIS — O99333 Smoking (tobacco) complicating pregnancy, third trimester: Secondary | ICD-10-CM | POA: Insufficient documentation

## 2016-03-29 DIAGNOSIS — N898 Other specified noninflammatory disorders of vagina: Secondary | ICD-10-CM

## 2016-03-29 DIAGNOSIS — O365930 Maternal care for other known or suspected poor fetal growth, third trimester, not applicable or unspecified: Secondary | ICD-10-CM | POA: Diagnosis not present

## 2016-03-29 DIAGNOSIS — O99513 Diseases of the respiratory system complicating pregnancy, third trimester: Secondary | ICD-10-CM | POA: Diagnosis not present

## 2016-03-29 DIAGNOSIS — O26893 Other specified pregnancy related conditions, third trimester: Secondary | ICD-10-CM

## 2016-03-29 DIAGNOSIS — O4100X0 Oligohydramnios, unspecified trimester, not applicable or unspecified: Secondary | ICD-10-CM

## 2016-03-29 DIAGNOSIS — O0992 Supervision of high risk pregnancy, unspecified, second trimester: Secondary | ICD-10-CM

## 2016-03-29 DIAGNOSIS — O99213 Obesity complicating pregnancy, third trimester: Secondary | ICD-10-CM | POA: Insufficient documentation

## 2016-03-29 DIAGNOSIS — O4103X0 Oligohydramnios, third trimester, not applicable or unspecified: Secondary | ICD-10-CM | POA: Insufficient documentation

## 2016-03-29 DIAGNOSIS — O4100X1 Oligohydramnios, unspecified trimester, fetus 1: Secondary | ICD-10-CM

## 2016-03-29 DIAGNOSIS — J45909 Unspecified asthma, uncomplicated: Secondary | ICD-10-CM | POA: Diagnosis not present

## 2016-03-29 LAB — WET PREP, GENITAL
CLUE CELLS WET PREP: NONE SEEN
Sperm: NONE SEEN
TRICH WET PREP: NONE SEEN
YEAST WET PREP: NONE SEEN

## 2016-03-29 LAB — POCT FERN TEST

## 2016-03-29 NOTE — MAU Note (Signed)
Sent from MFM to r/o ROM

## 2016-03-29 NOTE — MAU Provider Note (Signed)
History     CSN: [id number]  Arrival date and time: 03/29/16 1400   First Provider Initiated Contact with Patient 03/29/16 1504      Chief Complaint  Patient presents with  . Rupture of Membranes   HPI [name] is a 24yo G3P1011 @ 33.2wks who presents from MFM for eval of ROM. Feels she is needing to void more frequently and is concerned re leaking amniotic fluid due to this. Denies bldg or reg ctx. Her preg has been followed by the [facility] and has been remarkable for 1) oligo 2) IUGR 3) obesity  OB History    Gravida Para Term Preterm AB Living   3 1 1  0 1 1   SAB TAB Ectopic Multiple Live Births   1 0 0 0 1      Past Medical History:  Diagnosis Date  . Asthma     Past Surgical History:  Procedure Laterality Date  . NO PAST SURGERIES      Family History  Problem Relation Age of Onset  . Cancer Maternal Aunt   . Cancer Paternal Grandmother     Social History  Substance Use Topics  . Smoking status: Current Every Day Smoker    Packs/day: 0.25    Types: Cigarettes  . Smokeless tobacco: Current User  . Alcohol use No     Comment: occasionally not since pregnancy    Allergies: No Known Allergies  Prescriptions Prior to Admission  Medication Sig Dispense Refill Last Dose  . acetaminophen (TYLENOL) 325 MG tablet Take 650 mg by mouth every 6 (six) hours as needed for mild pain or headache. Reported on 03/19/2016   Past Month at Unknown time  . Prenatal Vit-Fe Fumarate-FA (MULTIVITAMIN-PRENATAL) 27-0.8 MG TABS tablet Take 1 tablet by mouth daily at 12 noon. 30 each 12 03/28/2016 at Unknown time  . ibuprofen (ADVIL,MOTRIN) 600 MG tablet Take 1 tablet (600 mg total) by mouth every 6 (six) hours as needed. (Patient not taking: Reported on 02/22/2016) 10 tablet 0 Not Taking at Unknown time  . polyethylene glycol (MIRALAX / GLYCOLAX) packet Take 17 g by mouth daily as needed for mild constipation or moderate constipation. (Patient not taking: Reported on 03/29/2016) 14 each 0  Not Taking at Unknown time  . ranitidine (ZANTAC) 150 MG tablet Take 1 tablet (150 mg total) by mouth 2 (two) times daily. (Patient not taking: Reported on 03/06/2016) 60 tablet 0 Not Taking at Unknown time    ROS No other pertinents other than what is listed in HPI Physical Exam   Last menstrual period 09/02/2015, unknown if currently breastfeeding. Vitals from MFM today: 54, 113/73 Physical Exam  Constitutional: She is oriented to person, place, and time. She appears well-developed.  Obese habitus  HENT:  Head: Normocephalic.  Neck: Normal range of motion.  Cardiovascular: Normal rate.   Respiratory: Effort normal.  GI:  EFM 135-140, +accels, no decels No ctx per toco  Genitourinary:  Genitourinary Comments: SSE: neg pool, neg fern; sm white vag d/c Cx: deferred  Musculoskeletal: Normal range of motion.  Neurological: She is alert and oriented to person, place, and time.  Skin: Skin is warm and dry.  Psychiatric: She has a normal mood and affect. Her behavior is normal. Thought content normal.   Wet prep: few WBC, mod bacteria  MAU Course  Procedures  MDM SSE NST Wet prep  Assessment and Plan  IUP@33 .2wks Vaginal discharge Oligo IUGR  D/C home F/U as scheduled at next OB/MFM visit  Cam Hai CNM 03/29/2016, 3:17 PM

## 2016-03-29 NOTE — Discharge Instructions (Signed)
Oligohydramnios °An unborn baby (fetus) lives in the mother's uterus in a sac of amniotic fluid. This fluid: °· Protects the fetus from trauma. °· Helps the fetus move freely inside the uterus. °· Helps the fetal lungs, kidneys, and digestive system develop. °· Protects the baby from infections.   °Oligohydramnios is when there is not enough amniotic fluid in the amniotic sac. If this happens early in pregnancy, a fetus might not develop normally. If this happens in the second half of a pregnancy, a fetus might not grow as much as it should and could cause problems during delivery.   °Oligohydramnios can also cause: °· Pregnancy loss (miscarriage). °· Premature birth. °· Deformities of the face or body. °· Problems with muscles and bones. °· Pressure and compression on the umbilical cord, which decreases oxygen to the fetus. °· Lung problems. °· Stillbirth. °CAUSES °A cause cannot always be found. However, possible causes include: °· A leak or a tear in the amniotic sac. °· A problem with the placenta. °· Having identical twins who share the same placenta. °· A fetal birth defect. This is usually something in the fetal kidneys or urinary tract that has not developed as it should. °· A pregnancy that goes past the due date. °· Something that affects the mother's body, such as: °¨ Dehydration. °¨ High blood pressure. °¨ Diabetes. °¨ Some medications (examples include ibuprofen and blood pressure medicines).  °¨ A disease that affects the skin, joints, kidneys, and other organs (systemic lupus). °¨ Birth defects. °SYMPTOMS °· There may be no symptoms.  °· Leaking fluid from the vagina. °· Measuring smaller than usual uterus at a routine pregnancy exam. °DIAGNOSIS °To diagnose and evaluate oligohydramnios, your caregiver may: °· Order a prenatal ultrasound test. This test: °¨ Measures the amniotic fluid level. This will show if the amount of fluid is right for the stage of pregnancy. °¨ Checks the fetal  kidneys. °¨ Checks fetal growth. °¨ Evaluates the placenta. °· Confirm that you broke your water, if this is suspected by your caregiver. °· Order a nonstress test. This noninvasive test is an assessment of fetal well-being. It monitors the fetal heart rate patterns over a 20-minute period. °· Order a biophysical profile. This test measures and evaluates 5 observations of the baby (results of nonstress testing, fetal breathing, movements, muscle tone, and amount of amniotic fluid). °· Assess fetal kick counts. Tell your caregiver if the baby appears to become less active. °· Order a uterine artery Doppler study. This is a type of ultrasound. It can show if enough blood and nourishment are getting to the fetus through the placenta and umbilical cord. °· Check your blood pressure. °· Check your blood sugar. °TREATMENT °Treatment will depend on how low the fluid is, how far along in the pregnancy you are, and your overall health. Treatment options include: °· Watching and waiting. You will need to be checked more often than normal. °· Increasing your fluid intake. This may be done by mouth, or you might get the fluids through the vein (intravenously, IV). °· Delivering the baby if recommended by your caregiver. °HOME CARE INSTRUCTIONS °· Only take medicine as directed by your caregiver, especially if you have a medical problem (diabetes, high blood pressure). °· Follow your caregiver's instructions regarding physical activity, especially if you have a medical problem (diabetes, high blood pressure). °· Keep all prenatal care appointments. °· Rest as much as possible. Your caregiver may put you on bed rest. °· Drink enough fluids to keep your urine clear or pale yellow. °·   Eat a healthy and nourishing diet. °· Do not smoke, drink alcohol, or take illegal drugs.  °SEEK MEDICAL CARE IF: °· You have any questions or worries about your pregnancy. °· You notice a decrease in fetal movement. °SEEK IMMEDIATE MEDICAL CARE IF:   °· Fluid comes out of your vagina. °· You start to have labor pains (contractions). °· You have a fever. °  °This information is not intended to replace advice given to you by your health care provider. Make sure you discuss any questions you have with your health care provider. °  °Document Released: 12/04/2010 Document Revised: 09/09/2014 Document Reviewed: 12/04/2010 °Elsevier Interactive Patient Education ©2016 [employer] Inc[employer] ° °

## 2016-04-01 ENCOUNTER — Ambulatory Visit (INDEPENDENT_AMBULATORY_CARE_PROVIDER_SITE_OTHER): Payer: Medicaid Other | Admitting: Obstetrics and Gynecology

## 2016-04-01 VITALS — BP 125/93 | HR 81 | Temp 98.5°F | Wt 300.0 lb

## 2016-04-01 DIAGNOSIS — O98319 Other infections with a predominantly sexual mode of transmission complicating pregnancy, unspecified trimester: Secondary | ICD-10-CM | POA: Diagnosis not present

## 2016-04-01 DIAGNOSIS — O365930 Maternal care for other known or suspected poor fetal growth, third trimester, not applicable or unspecified: Secondary | ICD-10-CM

## 2016-04-01 DIAGNOSIS — O99213 Obesity complicating pregnancy, third trimester: Secondary | ICD-10-CM | POA: Diagnosis not present

## 2016-04-01 DIAGNOSIS — O0993 Supervision of high risk pregnancy, unspecified, third trimester: Secondary | ICD-10-CM

## 2016-04-01 DIAGNOSIS — O4100X1 Oligohydramnios, unspecified trimester, fetus 1: Secondary | ICD-10-CM

## 2016-04-01 DIAGNOSIS — E669 Obesity, unspecified: Secondary | ICD-10-CM | POA: Diagnosis not present

## 2016-04-01 DIAGNOSIS — O133 Gestational [pregnancy-induced] hypertension without significant proteinuria, third trimester: Secondary | ICD-10-CM | POA: Diagnosis present

## 2016-04-01 DIAGNOSIS — A749 Chlamydial infection, unspecified: Secondary | ICD-10-CM | POA: Diagnosis not present

## 2016-04-01 DIAGNOSIS — O139 Gestational [pregnancy-induced] hypertension without significant proteinuria, unspecified trimester: Secondary | ICD-10-CM | POA: Insufficient documentation

## 2016-04-01 DIAGNOSIS — O98819 Other maternal infectious and parasitic diseases complicating pregnancy, unspecified trimester: Secondary | ICD-10-CM

## 2016-04-01 DIAGNOSIS — Z6841 Body Mass Index (BMI) 40.0 and over, adult: Secondary | ICD-10-CM | POA: Insufficient documentation

## 2016-04-01 LAB — COMPLETE METABOLIC PANEL WITH GFR
ALT: 10 U/L (ref 6–29)
AST: 12 U/L (ref 10–30)
Albumin: 3.1 g/dL — ABNORMAL LOW (ref 3.6–5.1)
Alkaline Phosphatase: 143 U/L — ABNORMAL HIGH (ref 33–115)
BILIRUBIN TOTAL: 0.2 mg/dL (ref 0.2–1.2)
BUN: 10 mg/dL (ref 7–25)
CALCIUM: 8.7 mg/dL (ref 8.6–10.2)
CO2: 21 mmol/L (ref 20–31)
CREATININE: 0.69 mg/dL (ref 0.50–1.10)
Chloride: 107 mmol/L (ref 98–110)
GFR, Est Non African American: >89 mL/min (ref >=60)
Glucose, Bld: 75 mg/dL (ref 65–99)
Potassium: 4.2 mmol/L (ref 3.5–5.3)
Sodium: 136 mmol/L (ref 135–146)
TOTAL PROTEIN: 5.4 g/dL — AB (ref 6.1–8.1)

## 2016-04-01 LAB — CBC
HEMATOCRIT: 40.5 % (ref 35.0–45.0)
HEMOGLOBIN: 13.8 g/dL (ref 11.7–15.5)
MCH: 29 pg (ref 27.0–33.0)
MCHC: 34.1 g/dL (ref 32.0–36.0)
MCV: 85.1 fL (ref 80.0–100.0)
MPV: 10.1 fL (ref 7.5–12.5)
Platelets: 270 10*3/uL (ref 140–400)
RBC: 4.76 MIL/uL (ref 3.80–5.10)
RDW: 14.5 % (ref 11.0–15.0)
WBC: 9.2 10*3/uL (ref 3.8–10.8)

## 2016-04-01 LAB — POCT URINALYSIS DIP (DEVICE)
BILIRUBIN URINE: NEGATIVE
Glucose, UA: NEGATIVE mg/dL
Ketones, ur: NEGATIVE mg/dL
LEUKOCYTES UA: NEGATIVE
NITRITE: NEGATIVE
PH: 6 (ref 5.0–8.0)
Protein, ur: 30 mg/dL — AB
SPECIFIC GRAVITY, URINE: 1.025 (ref 1.005–1.030)
Urobilinogen, UA: 0.2 mg/dL (ref 0.0–1.0)

## 2016-04-01 NOTE — Progress Notes (Signed)
Prenatal Visit Note Date: 04/01/2016 Clinic: [facility]  Subjective:  Robin [name] is a 25 y.o. G3P1011 at [redacted]w[redacted]d being seen today for ongoing prenatal care.  She is currently monitored for the following issues for this high-risk pregnancy and has Supervision of high risk pregnancy, antepartum; Oligohydramnios antepartum; Obesity affecting pregnancy in third trimester, antepartum; Chlamydia infection affecting pregnancy; IUGR (intrauterine growth restriction) affecting care of mother; and Gestational hypertension on her problem list.  Patient reports no complaints.   Contractions: Irregular. Vag. Bleeding: None.  Movement: Present. Denies leaking of fluid.   The following portions of the patient's history were reviewed and updated as appropriate: allergies, current medications, past family history, past medical history, past social history, past surgical history and problem list. Problem list updated.  Objective:   Vitals:   04/01/16 0846  BP: (!) 125/93  Pulse: 81  Temp: 98.5 F (36.9 C)  Weight: 300 lb (136.1 kg)    Fetal Status: Fetal Heart Rate (bpm): 135   Movement: Present     General:  Alert, oriented and cooperative. Patient is in no acute distress.  Skin: Skin is warm and dry. No rash noted.   Cardiovascular: Normal heart rate noted  Respiratory: Normal respiratory effort, no problems with respiration noted  Abdomen: Soft, gravid, appropriate for gestational age. Pain/Pressure: Present     Pelvic:  Cervical exam deferred        Extremities: Normal range of motion.  Edema: Trace  Mental Status: Normal mood and affect. Normal behavior. Normal judgment and thought content.   Urinalysis:      Assessment and Plan:  Pregnancy: G3P1011 at [redacted]w[redacted]d  1. Gestational hypertension, third trimester Diagnosis made today. Will get basic labs. Pt told as long as mild then no change in plan (delivery @ 37wks if all goes well). Pre-x precautions given. Will do qwk  provider visit and see her back later in the week for BP check only visit. Will also need qwk labs. See below for AP testing - CBC - COMPLETE METABOLIC PANEL WITH GFR - Protein / Creatinine Ratio, Urine  2. IUGR (intrauterine growth restriction) affecting care of mother, third trimester, not applicable or unspecified fetus F/u growth and BPP tomorrow with MFM. They have been doing qwk BPPs. Can adjust based on their recs and what they see tomorrow. [facility] precautions given to patient. S/p BMZ on 7/11-12  3. Chlamydia infection affecting pregnancy S/p negative TOC  4. Obesity affecting pregnancy in third trimester, antepartum See above  5. Oligohydramnios antepartum, unspecified trimester, fetus 1 See above  6. Supervision of high risk pregnancy, antepartum, third trimester nexplanon  Preterm labor symptoms and general obstetric precautions including but not limited to vaginal bleeding, contractions, leaking of fluid and fetal movement were reviewed in detail with the patient. Please refer to After Visit Summary for other counseling recommendations.  Return in about 1 week (around 04/08/2016).   Grafton Bing, MD

## 2016-04-02 ENCOUNTER — Ambulatory Visit (HOSPITAL_COMMUNITY): Admission: RE | Admit: 2016-04-02 | Payer: Medicaid Other | Source: Ambulatory Visit

## 2016-04-02 ENCOUNTER — Ambulatory Visit (HOSPITAL_COMMUNITY): Payer: Medicaid Other

## 2016-04-02 LAB — PROTEIN / CREATININE RATIO, URINE
Creatinine, Urine: 119 mg/dL (ref 20–320)
Protein Creatinine Ratio: 235 mg/g creat — ABNORMAL HIGH (ref 21–161)
TOTAL PROTEIN, URINE: 28 mg/dL — AB (ref 5–24)

## 2016-04-04 ENCOUNTER — Ambulatory Visit (HOSPITAL_COMMUNITY)
Admission: RE | Admit: 2016-04-04 | Discharge: 2016-04-04 | Disposition: A | Discharge status: Home / Self Care | Payer: Medicaid Other | Source: Ambulatory Visit | Attending: Family | Admitting: Family

## 2016-04-04 ENCOUNTER — Encounter (HOSPITAL_COMMUNITY): Payer: Self-pay

## 2016-04-04 DIAGNOSIS — O1493 Unspecified pre-eclampsia, third trimester: Secondary | ICD-10-CM | POA: Diagnosis not present

## 2016-04-04 DIAGNOSIS — O4100X0 Oligohydramnios, unspecified trimester, not applicable or unspecified: Secondary | ICD-10-CM | POA: Diagnosis not present

## 2016-04-04 DIAGNOSIS — O365930 Maternal care for other known or suspected poor fetal growth, third trimester, not applicable or unspecified: Secondary | ICD-10-CM | POA: Diagnosis not present

## 2016-04-04 DIAGNOSIS — IMO0002 Reserved for concepts with insufficient information to code with codable children: Secondary | ICD-10-CM

## 2016-04-04 DIAGNOSIS — Z3A34 34 weeks gestation of pregnancy: Secondary | ICD-10-CM | POA: Diagnosis not present

## 2016-04-04 DIAGNOSIS — O99332 Smoking (tobacco) complicating pregnancy, second trimester: Secondary | ICD-10-CM | POA: Insufficient documentation

## 2016-04-04 DIAGNOSIS — O99213 Obesity complicating pregnancy, third trimester: Secondary | ICD-10-CM | POA: Insufficient documentation

## 2016-04-08 ENCOUNTER — Ambulatory Visit (HOSPITAL_COMMUNITY)
Admission: RE | Admit: 2016-04-08 | Discharge: 2016-04-08 | Disposition: A | Discharge status: Home / Self Care | Payer: Medicaid Other | Source: Ambulatory Visit | Attending: Family | Admitting: Family

## 2016-04-08 ENCOUNTER — Other Ambulatory Visit (HOSPITAL_COMMUNITY): Payer: Self-pay | Admitting: Maternal and Fetal Medicine

## 2016-04-08 ENCOUNTER — Encounter: Payer: Medicaid Other | Admitting: Family Medicine

## 2016-04-08 ENCOUNTER — Encounter (HOSPITAL_COMMUNITY): Payer: Self-pay

## 2016-04-08 DIAGNOSIS — O4100X0 Oligohydramnios, unspecified trimester, not applicable or unspecified: Secondary | ICD-10-CM

## 2016-04-08 DIAGNOSIS — Z3A34 34 weeks gestation of pregnancy: Secondary | ICD-10-CM

## 2016-04-11 ENCOUNTER — Ambulatory Visit (HOSPITAL_COMMUNITY)
Admission: RE | Admit: 2016-04-11 | Discharge: 2016-04-11 | Disposition: A | Discharge status: Home / Self Care | Payer: Medicaid Other | Source: Ambulatory Visit | Attending: Family | Admitting: Family

## 2016-04-11 ENCOUNTER — Other Ambulatory Visit (HOSPITAL_COMMUNITY): Payer: Self-pay | Admitting: Maternal and Fetal Medicine

## 2016-04-11 ENCOUNTER — Encounter (HOSPITAL_COMMUNITY): Payer: Self-pay

## 2016-04-11 DIAGNOSIS — E669 Obesity, unspecified: Secondary | ICD-10-CM | POA: Diagnosis present

## 2016-04-11 DIAGNOSIS — O9989 Other specified diseases and conditions complicating pregnancy, childbirth and the puerperium: Secondary | ICD-10-CM | POA: Diagnosis not present

## 2016-04-11 DIAGNOSIS — O4103X0 Oligohydramnios, third trimester, not applicable or unspecified: Secondary | ICD-10-CM | POA: Diagnosis not present

## 2016-04-11 DIAGNOSIS — O1493 Unspecified pre-eclampsia, third trimester: Secondary | ICD-10-CM | POA: Diagnosis not present

## 2016-04-11 DIAGNOSIS — Z3A35 35 weeks gestation of pregnancy: Secondary | ICD-10-CM | POA: Insufficient documentation

## 2016-04-11 DIAGNOSIS — O99333 Smoking (tobacco) complicating pregnancy, third trimester: Secondary | ICD-10-CM | POA: Diagnosis not present

## 2016-04-11 DIAGNOSIS — J45909 Unspecified asthma, uncomplicated: Secondary | ICD-10-CM | POA: Diagnosis not present

## 2016-04-11 DIAGNOSIS — O99213 Obesity complicating pregnancy, third trimester: Secondary | ICD-10-CM | POA: Insufficient documentation

## 2016-04-11 DIAGNOSIS — O4100X0 Oligohydramnios, unspecified trimester, not applicable or unspecified: Secondary | ICD-10-CM

## 2016-04-11 DIAGNOSIS — O99519 Diseases of the respiratory system complicating pregnancy, unspecified trimester: Secondary | ICD-10-CM

## 2016-04-11 DIAGNOSIS — O365930 Maternal care for other known or suspected poor fetal growth, third trimester, not applicable or unspecified: Secondary | ICD-10-CM | POA: Insufficient documentation

## 2016-04-15 ENCOUNTER — Ambulatory Visit (HOSPITAL_COMMUNITY)
Admission: RE | Admit: 2016-04-15 | Discharge: 2016-04-15 | Disposition: A | Discharge status: Home / Self Care | Payer: Medicaid Other | Source: Ambulatory Visit | Attending: Family | Admitting: Family

## 2016-04-15 ENCOUNTER — Telehealth: Payer: Self-pay | Admitting: *Deleted

## 2016-04-15 ENCOUNTER — Telehealth (HOSPITAL_COMMUNITY): Payer: Self-pay | Admitting: *Deleted

## 2016-04-15 ENCOUNTER — Encounter (HOSPITAL_COMMUNITY): Payer: Self-pay

## 2016-04-15 DIAGNOSIS — Z3A35 35 weeks gestation of pregnancy: Secondary | ICD-10-CM | POA: Diagnosis not present

## 2016-04-15 DIAGNOSIS — O99213 Obesity complicating pregnancy, third trimester: Secondary | ICD-10-CM | POA: Insufficient documentation

## 2016-04-15 DIAGNOSIS — O365930 Maternal care for other known or suspected poor fetal growth, third trimester, not applicable or unspecified: Secondary | ICD-10-CM | POA: Diagnosis not present

## 2016-04-15 DIAGNOSIS — O4100X0 Oligohydramnios, unspecified trimester, not applicable or unspecified: Secondary | ICD-10-CM | POA: Diagnosis not present

## 2016-04-15 DIAGNOSIS — O1493 Unspecified pre-eclampsia, third trimester: Secondary | ICD-10-CM | POA: Insufficient documentation

## 2016-04-15 DIAGNOSIS — O99333 Smoking (tobacco) complicating pregnancy, third trimester: Secondary | ICD-10-CM | POA: Insufficient documentation

## 2016-04-15 NOTE — Telephone Encounter (Signed)
Preadmission screen  

## 2016-04-15 NOTE — Telephone Encounter (Signed)
Received a voice message this am from Azerbaijaneirra. States needs to know induction date.

## 2016-04-16 ENCOUNTER — Encounter: Payer: Medicaid Other | Admitting: Family

## 2016-04-16 ENCOUNTER — Encounter (HOSPITAL_COMMUNITY): Payer: Self-pay | Admitting: *Deleted

## 2016-04-16 NOTE — Telephone Encounter (Signed)
Preadmission screen  

## 2016-04-16 NOTE — Telephone Encounter (Signed)
I called Robin [name] and she said she was given her induction date when she had her US.I reviewed date/time , report MAu 11:45 tomorrow night, may have light meal. She voices understanding.

## 2016-04-18 ENCOUNTER — Encounter (HOSPITAL_COMMUNITY): Payer: Self-pay

## 2016-04-18 ENCOUNTER — Ambulatory Visit (HOSPITAL_COMMUNITY): Admission: RE | Admit: 2016-04-18 | Payer: Medicaid Other | Source: Ambulatory Visit

## 2016-04-18 ENCOUNTER — Inpatient Hospital Stay (HOSPITAL_COMMUNITY)
Admission: RE | Admit: 2016-04-18 | Discharge: 2016-04-19 | DRG: 775 | Disposition: A | Discharge status: Home / Self Care | Payer: Medicaid Other | Source: Ambulatory Visit | Attending: Obstetrics & Gynecology | Admitting: Obstetrics & Gynecology

## 2016-04-18 DIAGNOSIS — O99334 Smoking (tobacco) complicating childbirth: Secondary | ICD-10-CM | POA: Diagnosis present

## 2016-04-18 DIAGNOSIS — F17210 Nicotine dependence, cigarettes, uncomplicated: Secondary | ICD-10-CM | POA: Diagnosis present

## 2016-04-18 DIAGNOSIS — O365990 Maternal care for other known or suspected poor fetal growth, unspecified trimester, not applicable or unspecified: Secondary | ICD-10-CM | POA: Diagnosis present

## 2016-04-18 DIAGNOSIS — O99214 Obesity complicating childbirth: Secondary | ICD-10-CM | POA: Diagnosis present

## 2016-04-18 DIAGNOSIS — Z3A36 36 weeks gestation of pregnancy: Secondary | ICD-10-CM

## 2016-04-18 DIAGNOSIS — Z6841 Body Mass Index (BMI) 40.0 and over, adult: Secondary | ICD-10-CM | POA: Diagnosis not present

## 2016-04-18 DIAGNOSIS — O365930 Maternal care for other known or suspected poor fetal growth, third trimester, not applicable or unspecified: Secondary | ICD-10-CM | POA: Diagnosis present

## 2016-04-18 DIAGNOSIS — E669 Obesity, unspecified: Secondary | ICD-10-CM | POA: Diagnosis present

## 2016-04-18 DIAGNOSIS — O99324 Drug use complicating childbirth: Secondary | ICD-10-CM | POA: Diagnosis present

## 2016-04-18 DIAGNOSIS — O1414 Severe pre-eclampsia complicating childbirth: Secondary | ICD-10-CM | POA: Diagnosis present

## 2016-04-18 DIAGNOSIS — O4100X0 Oligohydramnios, unspecified trimester, not applicable or unspecified: Secondary | ICD-10-CM | POA: Diagnosis present

## 2016-04-18 DIAGNOSIS — F1210 Cannabis abuse, uncomplicated: Secondary | ICD-10-CM | POA: Diagnosis present

## 2016-04-18 DIAGNOSIS — O4103X0 Oligohydramnios, third trimester, not applicable or unspecified: Principal | ICD-10-CM | POA: Diagnosis present

## 2016-04-18 DIAGNOSIS — O139 Gestational [pregnancy-induced] hypertension without significant proteinuria, unspecified trimester: Secondary | ICD-10-CM | POA: Diagnosis present

## 2016-04-18 DIAGNOSIS — O0990 Supervision of high risk pregnancy, unspecified, unspecified trimester: Secondary | ICD-10-CM

## 2016-04-18 LAB — CBC
HCT: 37.2 % (ref 36.0–46.0)
HEMOGLOBIN: 13 g/dL (ref 12.0–15.0)
MCH: 28.6 pg (ref 26.0–34.0)
MCHC: 34.9 g/dL (ref 30.0–36.0)
MCV: 81.8 fL (ref 78.0–100.0)
Platelets: 194 10*3/uL (ref 150–400)
RBC: 4.55 MIL/uL (ref 3.87–5.11)
RDW: 14.1 % (ref 11.5–15.5)
WBC: 9.2 10*3/uL (ref 4.0–10.5)

## 2016-04-18 LAB — COMPREHENSIVE METABOLIC PANEL
ALT: 28 U/L (ref 14–54)
AST: 19 U/L (ref 15–41)
Albumin: 2.8 g/dL — ABNORMAL LOW (ref 3.5–5.0)
Alkaline Phosphatase: 148 U/L — ABNORMAL HIGH (ref 38–126)
Anion gap: 7 (ref 5–15)
BUN: 13 mg/dL (ref 6–20)
CHLORIDE: 107 mmol/L (ref 101–111)
CO2: 20 mmol/L — AB (ref 22–32)
CREATININE: 0.66 mg/dL (ref 0.44–1.00)
Calcium: 8.6 mg/dL — ABNORMAL LOW (ref 8.9–10.3)
GFR calc non Af Amer: >60 mL/min (ref >60)
Glucose, Bld: 109 mg/dL — ABNORMAL HIGH (ref 65–99)
POTASSIUM: 3.8 mmol/L (ref 3.5–5.1)
SODIUM: 134 mmol/L — AB (ref 135–145)
Total Bilirubin: 0.2 mg/dL — ABNORMAL LOW (ref 0.3–1.2)
Total Protein: 6.4 g/dL — ABNORMAL LOW (ref 6.5–8.1)

## 2016-04-18 LAB — TYPE AND SCREEN
ABO/RH(D): A POS
Antibody Screen: NEGATIVE

## 2016-04-18 LAB — PROTEIN / CREATININE RATIO, URINE
Creatinine, Urine: 43 mg/dL
Protein Creatinine Ratio: 0.51 mg/mg{Cre} — ABNORMAL HIGH (ref 0.00–0.15)
TOTAL PROTEIN, URINE: 22 mg/dL

## 2016-04-18 LAB — RAPID URINE DRUG SCREEN, HOSP PERFORMED
Amphetamines: NOT DETECTED
BARBITURATES: NOT DETECTED
BENZODIAZEPINES: NOT DETECTED
COCAINE: NOT DETECTED
OPIATES: NOT DETECTED
Tetrahydrocannabinol: POSITIVE — AB

## 2016-04-18 LAB — RPR: RPR: NONREACTIVE

## 2016-04-18 LAB — GROUP B STREP BY PCR: Group B strep by PCR: NEGATIVE

## 2016-04-18 LAB — OB RESULTS CONSOLE GBS: STREP GROUP B AG: NEGATIVE

## 2016-04-18 MED ORDER — LABETALOL HCL 5 MG/ML IV SOLN
INTRAVENOUS | Status: AC
Start: 2016-04-18 — End: 2016-04-18
  Administered 2016-04-18: 20 mg
  Filled 2016-04-18: qty 4

## 2016-04-18 MED ORDER — MISOPROSTOL 25 MCG QUARTER TABLET
25.0000 ug | ORAL_TABLET | ORAL | Status: DC | PRN
  Administered 2016-04-18: 25 ug via VAGINAL
  Filled 2016-04-18: qty 1
  Filled 2016-04-18: qty 0.25

## 2016-04-18 MED ORDER — TERBUTALINE SULFATE 1 MG/ML IJ SOLN
0.2500 mg | Freq: Once | INTRAMUSCULAR | Status: AC | PRN
  Administered 2016-04-18: 0.25 mg via SUBCUTANEOUS
  Filled 2016-04-18: qty 1

## 2016-04-18 MED ORDER — SOD CITRATE-CITRIC ACID 500-334 MG/5ML PO SOLN: 30.0000 mL | ORAL | Status: DC | PRN

## 2016-04-18 MED ORDER — MISOPROSTOL 200 MCG PO TABS: 50.0000 ug | ORAL_TABLET | ORAL | Status: DC | PRN

## 2016-04-18 MED ORDER — OXYTOCIN 40 UNITS IN LACTATED RINGERS INFUSION - SIMPLE MED
2.5000 [IU]/h | INTRAVENOUS | Status: DC
  Filled 2016-04-18: qty 1000

## 2016-04-18 MED ORDER — OXYTOCIN BOLUS FROM INFUSION
500.0000 mL | Freq: Once | INTRAVENOUS | Status: AC
  Administered 2016-04-19: 500 mL via INTRAVENOUS

## 2016-04-18 MED ORDER — MAGNESIUM SULFATE 50 % IJ SOLN
2.0000 g/h | INTRAVENOUS | Status: DC
  Administered 2016-04-18 – 2016-04-19 (×2): 2 g/h via INTRAVENOUS
  Filled 2016-04-18 (×2): qty 80

## 2016-04-18 MED ORDER — BETAMETHASONE SOD PHOS & ACET 6 (3-3) MG/ML IJ SUSP
12.0000 mg | INTRAMUSCULAR | Status: AC
  Administered 2016-04-18 – 2016-04-19 (×2): 12 mg via INTRAMUSCULAR
  Filled 2016-04-18 (×2): qty 2

## 2016-04-18 MED ORDER — TERBUTALINE SULFATE 1 MG/ML IJ SOLN: 0.2500 mg | Freq: Once | INTRAMUSCULAR | Status: DC | PRN

## 2016-04-18 MED ORDER — LACTATED RINGERS IV SOLN
INTRAVENOUS | Status: DC
  Administered 2016-04-18 – 2016-04-19 (×4): via INTRAVENOUS

## 2016-04-18 MED ORDER — LIDOCAINE HCL (PF) 1 % IJ SOLN
30.0000 mL | INTRAMUSCULAR | Status: DC | PRN
  Filled 2016-04-18: qty 30

## 2016-04-18 MED ORDER — PENICILLIN G POTASSIUM 5000000 UNITS IJ SOLR
2.5000 10*6.[IU] | INTRAVENOUS | Status: DC
  Filled 2016-04-18 (×9): qty 2.5

## 2016-04-18 MED ORDER — LACTATED RINGERS IV SOLN: 500.0000 mL | INTRAVENOUS | Status: DC | PRN

## 2016-04-18 MED ORDER — DEXTROSE 5 % IV SOLN
5.0000 10*6.[IU] | Freq: Once | INTRAVENOUS | Status: AC
  Administered 2016-04-18: 5 10*6.[IU] via INTRAVENOUS
  Filled 2016-04-18: qty 5

## 2016-04-18 MED ORDER — ACETAMINOPHEN 325 MG PO TABS: 650.0000 mg | ORAL_TABLET | ORAL | Status: DC | PRN

## 2016-04-18 MED ORDER — FENTANYL CITRATE (PF) 100 MCG/2ML IJ SOLN
100.0000 ug | INTRAMUSCULAR | Status: DC | PRN
  Administered 2016-04-18 – 2016-04-19 (×5): 100 ug via INTRAVENOUS
  Filled 2016-04-18 (×5): qty 2

## 2016-04-18 MED ORDER — ONDANSETRON HCL 4 MG/2ML IJ SOLN: 4.0000 mg | Freq: Four times a day (QID) | INTRAMUSCULAR | Status: DC | PRN

## 2016-04-18 MED ORDER — LABETALOL HCL 5 MG/ML IV SOLN
20.0000 mg | INTRAVENOUS | Status: DC | PRN
  Administered 2016-04-18: 40 mg via INTRAVENOUS
  Filled 2016-04-18: qty 8

## 2016-04-18 MED ORDER — BETAMETHASONE SOD PHOS & ACET 6 (3-3) MG/ML IJ SUSP
12.5000 mg | INTRAMUSCULAR | Status: DC
  Filled 2016-04-18: qty 2.1

## 2016-04-18 MED ORDER — MAGNESIUM SULFATE BOLUS VIA INFUSION
4.0000 g | Freq: Once | INTRAVENOUS | Status: AC
  Administered 2016-04-18: 4 g via INTRAVENOUS
  Filled 2016-04-18: qty 500

## 2016-04-18 MED ORDER — HYDRALAZINE HCL 20 MG/ML IJ SOLN: 10.0000 mg | Freq: Once | INTRAMUSCULAR | Status: DC | PRN

## 2016-04-18 MED ORDER — OXYCODONE-ACETAMINOPHEN 5-325 MG PO TABS: 1.0000 | ORAL_TABLET | ORAL | Status: DC | PRN

## 2016-04-18 MED ORDER — OXYCODONE-ACETAMINOPHEN 5-325 MG PO TABS: 2.0000 | ORAL_TABLET | ORAL | Status: DC | PRN

## 2016-04-18 MED ORDER — OXYTOCIN 40 UNITS IN LACTATED RINGERS INFUSION - SIMPLE MED
1.0000 m[IU]/min | INTRAVENOUS | Status: DC
  Administered 2016-04-18: 2 m[IU]/min via INTRAVENOUS

## 2016-04-18 NOTE — Anesthesia Pain Management Evaluation Note (Signed)
  CRNA Pain Management Visit Note  Patient: Robin [name], 25 y.o., female  "Hello I am a member of the anesthesia team at [facility]. We have an anesthesia team available at all times to provide care throughout the hospital, including epidural management and anesthesia for C-section. I don't know your plan for the delivery whether it a natural birth, water birth, IV sedation, nitrous supplementation, doula or epidural, but we want to meet your pain goals."   1.Was your pain managed to your expectations on prior hospitalizations?   Yes   2.What is your expectation for pain management during this hospitalization?     Epidural  3.How can we help you reach that goal? epidural  Record the patient's initial score and the patient's pain goal.   Pain: 8  Pain Goal: 6 [facility] wants you to be able to say your pain was always managed very well.  Robin [name] 04/18/2016     Discussed with nurse about patient's pain level, nurse in room with patient.

## 2016-04-18 NOTE — H&P (Signed)
Robin ApleyCeirra Walth is a 25 y.o. female G3P1011 @ 36.1wks presenting for IOL due to oligo & IUGR. Her preg has been followed by the [facility] and has also been remarkable for 1) recent dx of gHTN @ 33.5wks 2) GBS unk 3) BMI 60 4) +chlam early in preg w/ neg TOC  OB History    Gravida Para Term Preterm AB Living   3 1 1  0 1 1   SAB TAB Ectopic Multiple Live Births   1 0 0 0 1     Past Medical History:  Diagnosis Date  . Asthma   . Preeclampsia    Past Surgical History:  Procedure Laterality Date  . NO PAST SURGERIES     Family History: family history includes Cancer in her maternal aunt and paternal grandmother. Social History:  reports that she has been smoking Cigarettes.  She has been smoking about 0.25 packs per day. She has never used smokeless tobacco. She reports that she uses drugs, including Marijuana. She reports that she does not drink alcohol.     Maternal Diabetes: No Genetic Screening: Normal Maternal Ultrasounds/Referrals: Abnormal:  Findings:   IUGR& oligo Fetal Ultrasounds or other Referrals:  None Maternal Substance Abuse:  Yes:  Type: Marijuana Significant Maternal Medications:  None Significant Maternal Lab Results:  Lab values include: Other: GBS unk Other Comments:  rec'd BMZ 7/11 and 7/12, then rescue dose 8/17; now severe pre-e and on Mag  ROS History Dilation: 1 Effacement (%): Thick Station: Ballotable Exam by:: natalie deal,rn Blood pressure (!) 145/80, pulse 63, temperature 98.3 F (36.8 C), temperature source Oral, resp. rate 20, height 5' (1.524 m), weight (!) 139.3 kg (307 lb), last menstrual period 09/02/2015, SpO2 100 %, unknown if currently breastfeeding. Exam Physical Exam  Constitutional: She is oriented to person, place, and time. She appears well-developed.  HENT:  Head: Normocephalic.  Neck: Normal range of motion.  Cardiovascular: Normal rate.   Respiratory: Effort normal.  GI:  EFM 120-130s, +accels, occ mi variables Irreg ctx; mild   Genitourinary: Vagina normal.  Musculoskeletal: Normal range of motion.  Neurological: She is alert and oriented to person, place, and time.  Skin: Skin is warm and dry.  Psychiatric: She has a normal mood and affect. Her behavior is normal. Thought content normal.    Prenatal labs: ABO, Rh: --/--/A POS (08/17 0140) Antibody: NEG (08/17 0140) Rubella: 5.97 (04/04 1021) RPR: NON REAC (06/29 1109)  HBsAg: NEGATIVE (04/04 1021)  HIV: NONREACTIVE (06/29 1109)  GBS:   UNK  Assessment/Plan: IUP@36 .1wks Severe pre-e IUGR/oligo GBS unk Cx unfavorable  [facility] Given cytotec x 1 dose during the night; foley placed this AM Starting mag sulfate due to severe range BPs controlled w/ IV labetalol PCN w/ active labor/ROM   SHAW, KIMBERLY CNM 04/18/2016, 7:29 AM

## 2016-04-18 NOTE — Progress Notes (Signed)
Robin [name] is a 25 y.o. G3P1011 at 5370w1d by ultrasound admitted for IUGR and oligohydramnios  Subjective: Pt c/o having a sore vagina. She reports occ contractions.   Objective: BP (!) 144/94   Pulse 63   Temp 98.5 F (36.9 C) (Oral)   Resp 20   Ht 5' (1.524 m)   Wt (!) 307 lb (139.3 kg)   LMP 09/02/2015   SpO2 100%   BMI 59.96 kg/m  I/O last 3 completed shifts: In: 2520 [P.O.:60; I.V.:2460] Out: 2075 [Urine:2075] Total I/O In: 606.1 [P.O.:290; I.V.:316.1] Out: 250 [Urine:250]  FHT:  FHR: 120's bpm, variability: moderate,  accelerations:  Present,  decelerations:  Absent UC:   irregular, every 5-6 minutes SVE:   Dilation: 3 Effacement (%): 30 Station: Ballotable Exam by:: Dr. Lenn SinkHarrway-Smith   Labs: Lab Results  Component Value Date   WBC 9.2 04/18/2016   HGB 13.0 04/18/2016   HCT 37.2 04/18/2016   MCV 81.8 04/18/2016   PLT 194 04/18/2016    Assessment / Plan: Induction of labor due to preeclampsia, IUGR and oligohydramnios,  progressing well on pitocin  Labor: still undergoing cervical ripening Preeclampsia:  on magnesium sulfate Fetal Wellbeing:  Category I Pain Control:  Labor support without medications I/D:  n/a Anticipated MOD:  NSVD.  Pt s/p SVD 8 years ago  [name], Robin Burningham 04/18/2016, 9:49 PM

## 2016-04-19 ENCOUNTER — Inpatient Hospital Stay (HOSPITAL_COMMUNITY): Payer: Medicaid Other | Admitting: Anesthesiology

## 2016-04-19 ENCOUNTER — Encounter (HOSPITAL_COMMUNITY): Payer: Self-pay

## 2016-04-19 DIAGNOSIS — O4103X0 Oligohydramnios, third trimester, not applicable or unspecified: Secondary | ICD-10-CM

## 2016-04-19 DIAGNOSIS — Z3A36 36 weeks gestation of pregnancy: Secondary | ICD-10-CM

## 2016-04-19 DIAGNOSIS — O365930 Maternal care for other known or suspected poor fetal growth, third trimester, not applicable or unspecified: Secondary | ICD-10-CM

## 2016-04-19 DIAGNOSIS — O1414 Severe pre-eclampsia complicating childbirth: Secondary | ICD-10-CM

## 2016-04-19 DIAGNOSIS — O1490 Unspecified pre-eclampsia, unspecified trimester: Secondary | ICD-10-CM

## 2016-04-19 LAB — CBC
HCT: 38.8 % (ref 36.0–46.0)
HEMATOCRIT: 38.8 % (ref 36.0–46.0)
HEMOGLOBIN: 13.4 g/dL (ref 12.0–15.0)
Hemoglobin: 13.4 g/dL (ref 12.0–15.0)
MCH: 28.8 pg (ref 26.0–34.0)
MCH: 28.6 pg (ref 26.0–34.0)
MCHC: 34.5 g/dL (ref 30.0–36.0)
MCHC: 34.5 g/dL (ref 30.0–36.0)
MCV: 83.3 fL (ref 78.0–100.0)
MCV: 82.7 fL (ref 78.0–100.0)
Platelets: 216 10*3/uL (ref 150–400)
Platelets: 211 K/uL (ref 150–400)
RBC: 4.66 MIL/uL (ref 3.87–5.11)
RBC: 4.69 MIL/uL (ref 3.87–5.11)
RDW: 13.9 % (ref 11.5–15.5)
RDW: 14.1 % (ref 11.5–15.5)
WBC: 14.7 10*3/uL — ABNORMAL HIGH (ref 4.0–10.5)
WBC: 23.1 K/uL — ABNORMAL HIGH (ref 4.0–10.5)

## 2016-04-19 MED ORDER — ONDANSETRON HCL 4 MG/2ML IJ SOLN
4.0000 mg | INTRAMUSCULAR | Status: DC | PRN
Start: 2016-04-19 — End: 2016-04-20

## 2016-04-19 MED ORDER — LACTATED RINGERS IV SOLN
500.0000 mL | Freq: Once | INTRAVENOUS | Status: AC
  Administered 2016-04-19: 500 mL via INTRAVENOUS

## 2016-04-19 MED ORDER — TETANUS-DIPHTH-ACELL PERTUSSIS 5-2.5-18.5 LF-MCG/0.5 IM SUSP: 0.5000 mL | Freq: Once | INTRAMUSCULAR | Status: DC

## 2016-04-19 MED ORDER — AMLODIPINE BESYLATE 10 MG PO TABS
10.0000 mg | ORAL_TABLET | Freq: Every day | ORAL | Status: DC
  Administered 2016-04-19: 10 mg via ORAL
  Filled 2016-04-19 (×2): qty 1

## 2016-04-19 MED ORDER — LORAZEPAM 1 MG PO TABS
1.0000 mg | ORAL_TABLET | Freq: Once | ORAL | Status: AC
  Administered 2016-04-19: 1 mg via ORAL
  Filled 2016-04-19: qty 1

## 2016-04-19 MED ORDER — FENTANYL 2.5 MCG/ML BUPIVACAINE 1/10 % EPIDURAL INFUSION (WH - ANES)
14.0000 mL/h | INTRAMUSCULAR | Status: DC | PRN
  Administered 2016-04-19: 14 mL/h via EPIDURAL
  Filled 2016-04-19: qty 125

## 2016-04-19 MED ORDER — ACETAMINOPHEN 325 MG PO TABS: 650.0000 mg | ORAL_TABLET | ORAL | Status: DC | PRN

## 2016-04-19 MED ORDER — IBUPROFEN 600 MG PO TABS
600.0000 mg | ORAL_TABLET | Freq: Four times a day (QID) | ORAL | Status: DC
  Administered 2016-04-19 (×2): 600 mg via ORAL
  Filled 2016-04-19 (×2): qty 1

## 2016-04-19 MED ORDER — DIPHENHYDRAMINE HCL 50 MG/ML IJ SOLN: 12.5000 mg | INTRAMUSCULAR | Status: DC | PRN

## 2016-04-19 MED ORDER — SIMETHICONE 80 MG PO CHEW: 80.0000 mg | CHEWABLE_TABLET | ORAL | Status: DC | PRN

## 2016-04-19 MED ORDER — ONDANSETRON HCL 4 MG PO TABS: 4.0000 mg | ORAL_TABLET | ORAL | Status: DC | PRN

## 2016-04-19 MED ORDER — BENZOCAINE-MENTHOL 20-0.5 % EX AERO: 1.0000 "application " | INHALATION_SPRAY | CUTANEOUS | Status: DC | PRN

## 2016-04-19 MED ORDER — NICOTINE 7 MG/24HR TD PT24
7.0000 mg | MEDICATED_PATCH | Freq: Every day | TRANSDERMAL | Status: DC
  Filled 2016-04-19 (×2): qty 1

## 2016-04-19 MED ORDER — EPHEDRINE 5 MG/ML INJ
10.0000 mg | INTRAVENOUS | Status: DC | PRN
  Filled 2016-04-19: qty 4

## 2016-04-19 MED ORDER — DIBUCAINE 1 % RE OINT: 1.0000 "application " | TOPICAL_OINTMENT | RECTAL | Status: DC | PRN

## 2016-04-19 MED ORDER — PRENATAL MULTIVITAMIN CH: 1.0000 | ORAL_TABLET | Freq: Every day | ORAL | Status: DC

## 2016-04-19 MED ORDER — PHENYLEPHRINE 40 MCG/ML (10ML) SYRINGE FOR IV PUSH (FOR BLOOD PRESSURE SUPPORT)
80.0000 ug | PREFILLED_SYRINGE | INTRAVENOUS | Status: DC | PRN
  Filled 2016-04-19: qty 5

## 2016-04-19 MED ORDER — PHENYLEPHRINE 40 MCG/ML (10ML) SYRINGE FOR IV PUSH (FOR BLOOD PRESSURE SUPPORT)
80.0000 ug | PREFILLED_SYRINGE | INTRAVENOUS | Status: DC | PRN
  Filled 2016-04-19: qty 10
  Filled 2016-04-19: qty 5

## 2016-04-19 MED ORDER — WITCH HAZEL-GLYCERIN EX PADS: 1.0000 "application " | MEDICATED_PAD | CUTANEOUS | Status: DC | PRN

## 2016-04-19 MED ORDER — HYDROXYZINE HCL 25 MG PO TABS
25.0000 mg | ORAL_TABLET | Freq: Three times a day (TID) | ORAL | Status: DC | PRN
  Filled 2016-04-19: qty 1

## 2016-04-19 MED ORDER — SENNOSIDES-DOCUSATE SODIUM 8.6-50 MG PO TABS: 2.0000 | ORAL_TABLET | ORAL | Status: DC

## 2016-04-19 MED ORDER — DIPHENHYDRAMINE HCL 25 MG PO CAPS: 25.0000 mg | ORAL_CAPSULE | Freq: Four times a day (QID) | ORAL | Status: DC | PRN

## 2016-04-19 MED ORDER — COCONUT OIL OIL: 1.0000 "application " | TOPICAL_OIL | Status: DC | PRN

## 2016-04-19 MED ORDER — AMLODIPINE BESYLATE 10 MG PO TABS: 10.0000 mg | ORAL_TABLET | Freq: Every day | ORAL | 1 refills | Status: DC

## 2016-04-19 MED ORDER — LIDOCAINE HCL (PF) 1 % IJ SOLN
INTRAMUSCULAR | Status: DC | PRN
  Administered 2016-04-19: 9 mL via EPIDURAL
  Administered 2016-04-19: 7 mL via EPIDURAL

## 2016-04-19 MED ORDER — ZOLPIDEM TARTRATE 5 MG PO TABS: 5.0000 mg | ORAL_TABLET | Freq: Every evening | ORAL | Status: DC | PRN

## 2016-04-19 NOTE — Progress Notes (Signed)
Post Partum Day 0 Subjective: pt insists on signing out ama to take care of family issues, 4 kids, grandmother seeking medical care. pt refuses option of her sister(here) addressing probs at home.   Objective: Blood pressure 130/84, pulse 72, temperature 98.1 F (36.7 C), temperature source Oral, resp. rate 20, height 5' (1.524 m), weight (!) 307 lb (139.3 kg), last menstrual period 09/02/2015, SpO2 100 %, unknown if currently breastfeeding.  Physical Exam:  General: alert, cooperative, no distress and morbidly obese Lochia: appropriate Uterine Fundus: unable to feel Incision:  DVT Evaluation: No evidence of DVT seen on physical exam.   Recent Labs  04/19/16 0226 04/19/16 1108  HGB 13.4 13.4  HCT 38.8 38.8  Temp:  [97.7 F (36.5 C)-98.6 F (37 C)] 98.1 F (36.7 C) (08/18 1835) Pulse Rate:  [52-141] 72 (08/18 1835) Resp:  [16-22] 20 (08/18 1900) BP: (76-182)/(38-124) 130/84 (08/18 1835) SpO2:  [98 %-100 %] 100 % (08/18 1835)   BP 130/84 (BP Location: Left Arm)   Pulse 72   Temp 98.1 F (36.7 C) (Oral)   Resp 20   Ht 5' (1.524 m)   Wt (!) 307 lb (139.3 kg)   LMP 09/02/2015   SpO2 100%   Breastfeeding? Unknown   BMI 59.96 kg/m   Assessment/Plan: Discharge home, Breastfeeding and Contraception undetermined   LOS: 1 day   Eliakim Tendler V 04/19/2016, 7:48 PM

## 2016-04-19 NOTE — Lactation Note (Signed)
This note was copied from a baby's chart. Lactation Consultation Note  Patient Name: Robin [name] FAOZH'YToday's Date: 04/19/2016 Reason for consult: Initial assessment;NICU baby;Infant < 6lbs;Late preterm infant, SGA, weighing 3 pounds, and 36 2/[redacted] weeks gestation, now 5 hours old. I started mom pumping with DEP - she wants to provide EBm for this baby, but also formula feed. I taught mom how to hand express, and her colostrum was flowing in a steady stream. With pumping and hand expression, she collected about 3 ml's to bring to the baby. Mom is on mag drip, so will need her Rn to go to nICU with her. Mom encouraged to hold baby ski to skin when possible, and to pump after doing so. Mom is active with [facility], and a faxfor DEP was sent. [facility]. Mom knows to call for questions/concerns.    Maternal Data Formula Feeding for Exclusion: Yes (baby in NICU) Reason for exclusion: Mother's choice to formula and breast feed on admission Has patient been taught Hand Expression?: Yes Does the patient have breastfeeding experience prior to this delivery?: No  Feeding    LATCH Score/Interventions                      Lactation Tools Discussed/Used WIC Program: Yes Pump Review: Setup, frequency, and cleaning;Milk Storage;Other (comment) (hand expression, review of NICU booklet, pump settings) Initiated by:: Oneida Mckamey,RN, IBCLC Date initiated:: 04/19/16   Consult Status Consult Status: Follow-up (at 5 hour spost partum) Date: 04/20/16 Follow-up type: In-patient    Alfred LevinsLee, [name] 04/19/2016, 2:31 PM

## 2016-04-19 NOTE — Anesthesia Preprocedure Evaluation (Signed)
Anesthesia Evaluation  Patient identified by MRN, date of birth, ID band Patient awake    Reviewed: Allergy & Precautions, H&P , Patient's Chart, lab work & pertinent test results  Airway Mallampati: III  TM Distance: >3 FB Neck ROM: full    Dental no notable dental hx.    Pulmonary Current Smoker,    Pulmonary exam normal        Cardiovascular hypertension, negative cardio ROS Normal cardiovascular exam     Neuro/Psych negative neurological ROS  negative psych ROS   GI/Hepatic negative GI ROS, Neg liver ROS,   Endo/Other  Morbid obesity  Renal/GU negative Renal ROS     Musculoskeletal   Abdominal (+) + obese,   Peds  Hematology negative hematology ROS (+)   Anesthesia Other Findings   Reproductive/Obstetrics (+) Pregnancy                             Anesthesia Physical Anesthesia Plan  ASA: III  Anesthesia Plan: Epidural   Post-op Pain Management:    Induction:   Airway Management Planned:   Additional Equipment:   Intra-op Plan:   Post-operative Plan:   Informed Consent: I have reviewed the patients History and Physical, chart, labs and discussed the procedure including the risks, benefits and alternatives for the proposed anesthesia with the patient or authorized representative who has indicated his/her understanding and acceptance.     Plan Discussed with:   Anesthesia Plan Comments:         Anesthesia Quick Evaluation

## 2016-04-19 NOTE — Progress Notes (Signed)
Delivery of live viable female by Lujean RaveLauren McDaniel RNC. Cheryle HorsfallAshley Shwartz at bedside, NICU requested. Providers on way to delivery

## 2016-04-19 NOTE — Progress Notes (Signed)
   Robin ApleyCeirra [name] is a 25 y.o. G3P1011 at 7238w2d  admitted for induction of labor due to oligohydramnios.  Subjective:  Ctx are getting a little stronger Objective: Vitals:   04/19/16 0000 04/19/16 0005 04/19/16 0030 04/19/16 0100  BP: (!) 148/124 (!) 150/88 (!) 163/84 (!) 168/110  Pulse: (!) 59 (!) 59 69 62  Resp:   16 18  Temp: 97.7 F (36.5 C)     TempSrc: Oral     SpO2: 100% 100% 99% 99%  Weight:      Height:       Total I/O In: 1352.1 [P.O.:290; I.V.:812.1; IV Piggyback:250] Out: 1400 [Urine:1400]  FHT:  FHR: 120 bpm, variability: minimal to moderate,  accelerations:  Present,  decelerations:  Present occ late, occ variable; all w/return to baseline and consistent moderate variability UC:   irregular, every 2-6 minutes SVE:   Dilation: 3.5 Effacement (%): 60 Station: -1 Exam by:: Cheral MarkerL. Chambers RN Pitocin @ 16 mu/min  Labs: Lab Results  Component Value Date   WBC 9.2 04/18/2016   HGB 13.0 04/18/2016   HCT 37.2 04/18/2016   MCV 81.8 04/18/2016   PLT 194 04/18/2016    Assessment / Plan: IOL, slow Dr Erin FullingHarraway smith has review EFM.  Will continue to monitor and increase pitocin as FHR allows Labor: not in labor Fetal Wellbeing:  Category I and Category II Pain Control:  Labor support without medications Anticipated MOD:  NSVD  CRESENZO-DISHMAN,Gabrial Poppell 04/19/2016, 1:51 AM

## 2016-04-19 NOTE — Anesthesia Postprocedure Evaluation (Signed)
Anesthesia Post Note  Patient: Robin [name]  Procedure(s) Performed: * No procedures listed *  Patient location during evaluation: [facility] Anesthesia Type: Epidural Level of consciousness: awake, awake and alert, oriented and patient cooperative Pain management: pain level controlled Vital Signs Assessment: post-procedure vital signs reviewed and stable Respiratory status: spontaneous breathing, nonlabored ventilation and respiratory function stable Cardiovascular status: stable Postop Assessment: patient able to bend at knees, no headache, no backache and no signs of nausea or vomiting Anesthetic complications: no     Last Vitals:  Vitals:   04/19/16 1030 04/19/16 1129  BP: 136/83 (!) 147/110  Pulse: 73 91  Resp: 20 20  Temp: 37 C     Last Pain:  Vitals:   04/19/16 1030  TempSrc: Oral  PainSc:    Pain Goal: Patients Stated Pain Goal: 10 (04/18/16 1300)               Karmah Potocki L

## 2016-04-19 NOTE — Progress Notes (Signed)
I received a referral from pt's L/D nurse as well as her post-partum nurse that she was having a difficult time with her baby being in the NICU.   Pt has very little support and had some conflict this morning with her SO after delivery as well. She stated that she was angry that she had been told her baby was 5 pounds and would be old enough to [facility]  [address] [facility], but her baby was 3 pounds and ended up in the NICU.  She feels certain that her baby is going to die She feels guilty like it was her fault that her baby was so small because of her own health issues and she feels that she can't seem to do anything right. She has some significant stressors in her life and very little support and she does not want to abandon her baby with strangers.    I helped to put the NICU in perspective and to help her identify ways that she could take an active part in caring for her baby (like her own sleeping, eating and pumping).  She was able to come to a somewhat calmer place, but this is going to be difficult for her.  She will need some reassurance about her baby's condition as well.  Chaplain Dyanne CarrelKaty Illona Bulman, Bcc Pager, [phone] 2:39 PM

## 2016-04-19 NOTE — Discharge Summary (Signed)
OB Discharge Summary     Patient Name: Burton ApleyCeirra Chatterjee DOB: 1991-07-25 MRN: [id number]  Date of admission: 04/18/2016 Delivering MD: Gala LewandowskyMCDANIEL, LAUREN M   Date of discharge: 04/19/2016  Admitting diagnosis: INDUCTION for IUGR, pregnancy 36 weeks 2 days Intrauterine pregnancy: 1813w2d     Secondary diagnosis:  Active Problems:   Supervision of high risk pregnancy, antepartum   Oligohydramnios antepartum   IUGR (intrauterine growth restriction) affecting care of mother   Gestational hypertension  Additional problems:      Discharge diagnosis: Gestational Hypertension and IUGR, obesity  oligohydramnios                                                                                              Post partum procedures:Magnesium sulfate and 12 hours  Augmentation: Pitocin and Foley Balloon  Complications:   Hospital course:  Onset of Labor With Vaginal Delivery     25 y.o. yo [id number] at 4213w2d was admitted in labor for induction on 04/18/2016. Patient had an uncomplicated labor course as follows:  Membrane Rupture Time/Date: 9:20 PM ,04/18/2016   Intrapartum Procedures: Episiotomy: None [1]                                         Lacerations:  None [1]  Patient had a delivery of a Viable infant. 04/19/2016  Information for the patient's newborn:  Eulis FosterBeauford, Girl Orlando PennerCeirra [[id number]][[id number]]  Delivery Method: Vaginal, Spontaneous Delivery (Filed from Delivery Summary)    Pateint had an complicated postpartum courseThat was notable for gestational hypertension requiring magnesium sulfate postpartum. Patient remained agitated due to family drama and worries at home. The patient was willing to stay after counseling by Dr. Alysia PennaErvin 6 hours later was again insistent on going home" claiming she felt fine and would be fine. No convincing her the importance of continued use of magnesium sulfate. The patient was made fully aware of the concerns about blood pressure changes when she stopped her magnesium sulfate  and agreed starting Norvasc 10 mg daily at this time and having blood pressure check tomorrow when she comes to visit her baby which will be in the NICU.  She is ambulating, tolerating a regular diet, passing flatus, and urinating well. Patient is discharged home in stable condition on 04/19/16. Discharge blood pressure 130/84   Physical exam Vitals:   04/19/16 1730 04/19/16 1830 04/19/16 1835 04/19/16 1900  BP:   130/84   Pulse:   72   Resp: 18 20 20 20   Temp:   98.1 F (36.7 C)   TempSrc:   Oral   SpO2:   100%   Weight:      Height:       General: alert and no distress Lochia: appropriate Uterine Fundus: firm Incision: N/A DVT Evaluation: Negative Homan's sign. No significant calf/ankle edema. Labs: Lab Results  Component Value Date   WBC 23.1 (H) 04/19/2016   HGB 13.4 04/19/2016   HCT 38.8 04/19/2016   MCV 82.7 04/19/2016   PLT 211 04/19/2016  CMP Latest Ref Rng & Units 04/18/2016  Glucose 65 - 99 mg/dL 161(W109(H)  BUN 6 - 20 mg/dL 13  Creatinine 9.600.44 - 4.541.00 mg/dL 0.980.66  Sodium 119135 - 147145 mmol/L 134(L)  Potassium 3.5 - 5.1 mmol/L 3.8  Chloride 101 - 111 mmol/L 107  CO2 22 - 32 mmol/L 20(L)  Calcium 8.9 - 10.3 mg/dL 8.2(N8.6(L)  Total Protein 6.5 - 8.1 g/dL 6.4(L)  Total Bilirubin 0.3 - 1.2 mg/dL 5.6(O0.2(L)  Alkaline Phos 38 - 126 U/L 148(H)  AST 15 - 41 U/L 19  ALT 14 - 54 U/L 28    Discharge instruction: per After Visit Summary and "Baby and Me Booklet".  After visit meds:    Medication List    TAKE these medications   acetaminophen 325 MG tablet Commonly known as:  TYLENOL Take 650 mg by mouth every 6 (six) hours as needed for mild pain or headache. Reported on 03/19/2016   amLODipine 10 MG tablet Commonly known as:  NORVASC Take 1 tablet (10 mg total) by mouth daily.   multivitamin-prenatal 27-0.8 MG Tabs tablet Take 1 tablet by mouth daily at 12 noon.       Diet: routine diet  Activity: Advance as tolerated. Pelvic rest for 6 weeks.   Outpatient follow  up:1 day for blood pressure recheck Follow up Appt:No future appointments. Follow up Visit:No Follow-up on file.  Postpartum contraception: Not Discussed  Newborn Data: Live born female  Birth Weight: 3 lb 0.3 oz (1370 g) APGAR: 6, 7  Baby Feeding: Bottle and Breast Disposition:NICU   04/19/2016 Tilda BurrowFERGUSON,Morgin Halls V, MD

## 2016-04-19 NOTE — Anesthesia Procedure Notes (Signed)
Epidural Patient location during procedure: OB Start time: 04/19/2016 3:30 AM End time: 04/19/2016 3:35 AM  Staffing Anesthesiologist: Leilani AbleHATCHETT, Tala Eber Performed: anesthesiologist   Preanesthetic Checklist Completed: patient identified, surgical consent, pre-op evaluation, timeout performed, IV checked, risks and benefits discussed and monitors and equipment checked  Epidural Patient position: sitting Prep: site prepped and draped and DuraPrep Patient monitoring: continuous pulse ox and blood pressure Approach: midline Location: L3-L4 Injection technique: LOR air  Needle:  Needle type: Tuohy  Needle gauge: 17 G Needle length: 9 cm and 9 Needle insertion depth: 9 cm Catheter type: closed end flexible Catheter size: 19 Gauge Catheter at skin depth: 15 cm Test dose: negative and Other  Assessment Sensory level: T11 Events: blood not aspirated, injection not painful, no injection resistance, negative IV test and no paresthesia  Additional Notes Reason for block:procedure for pain

## 2016-04-19 NOTE — Progress Notes (Signed)
Patient ID: Robin ApleyCeirra [name], female   DOB: [birth year], 25 y.o.   MRN: [id number] Spoke to [name] about the importance of continuing Magnesium due to her Bp. Pt feels like she has no control. She would like to go outside to smoke, long IOL and infant in NICU. Pt very tearful. After speaking with pt we were able to agree to continue with magnesium, able to go outside with nurse but knows unable to smoke, will place nicotine patch, one time dose of Ativan and the PRN Vistaril.

## 2016-04-19 NOTE — Progress Notes (Signed)
   Robin ApleyCeirra [name] is a 25 y.o. G3P1011 at 4494w2d  admitted for induction of labor due to oligohydramnios.  Subjective:  Comfortable with epidural Objective: Vitals:   04/19/16 0400 04/19/16 0405 04/19/16 0429 04/19/16 0430  BP: 117/81 137/83  (!) 142/72  Pulse: 70 (!) 58 62 (!) 52  Resp: 18 18  16   Temp: 98.2 F (36.8 C)     TempSrc: Oral     SpO2: 100% 100% 99% 99%  Weight:      Height:       Total I/O In: 1803.3 [P.O.:290; I.V.:1259.1; Other:4.2; IV Piggyback:250] Out: 1900 [Urine:1900]  FHT:  FHR: 120 bpm, variability: minimal to moderate,  accelerations:  Present,  decelerations:  Present occ late, occ variable; all w/return to baseline and consistent moderate variability + scalp stim UC:   irregular, every 2-564minutes SVE:   Dilation: 5 Effacement (%): 70 Station: -1 Exam by:: L. Carpeter, RN  Pitocin @ 16 mu/min FSE replaced. Some moulding of vtx Labs: Lab Results  Component Value Date   WBC 14.7 (H) 04/19/2016   HGB 13.4 04/19/2016   HCT 38.8 04/19/2016   MCV 83.3 04/19/2016   PLT 216 04/19/2016    Assessment / Plan:  Labor:nearing active Fetal Wellbeing:  Category I and Category II Pain Control:  epidural Anticipated MOD:  NSVD  CRESENZO-DISHMAN,Robin [name] 04/19/2016, 5:16 AM

## 2016-04-23 ENCOUNTER — Encounter: Payer: Self-pay | Admitting: *Deleted

## 2016-04-25 NOTE — Progress Notes (Signed)
  CLINICAL SOCIAL WORK MATERNAL/CHILD NOTE  Patient Details  Name: Robin [name] MRN: [id number] Date of Birth: 04/19/2017  Date:  04/25/2016  Clinical Social Worker Initiating Note:  Robin [name] Date/ Time Initiated:  04/25/16/1200     Child's Name:  Robin [name]   Legal Guardian:  Mother   Need for Interpreter:  None   Date of Referral:  04/25/16     Reason for Referral:  Current Substance Use/Substance Use During Pregnancy    Referral Source:  NICU   Address[address] [place] [address]  Phone number:  [phone]   Household Members:  Minor Children, Significant Other, Relatives   Natural Supports (not living in the home):  Extended Family, Immediate Family, Spouse/significant other   Professional Supports: None   Employment: Unemployed   Type of Work:     Education:  Associate ProfessorHigh school graduate   Financial Resources:  [employer]   Other Resources:  Sales executiveood Stamps    Cultural/Religious Considerations Which May Impact Care:  None Reported  Strengths:  Ability to meet basic needs , Home prepared for child    Risk Factors/Current Problems:  Substance Use    Cognitive State:  Linear Thinking    Mood/Affect:  Interested    CSW Assessment: CSW spoke with [name] over the phone in an attempt to schedule an appointment to meet with [name] while infant is in NICU.  [name] reported that [name] has limited supports and transportation has been a barrier.  CSW offered [name] bus passes and encouraged [name] to find CSW when [name] returns to the hospital to obtain the bus passes.  [name] gave CSW permission to complete [name]'s assessment via telephone and to informed [name] about the hospital's policy and procedure. CSW made [name] aware that due to her baby being in the NICU, a cord test was performed for the infant and the test came back positive for marijuana. [name] acknowledged her marijuana use and communicated the usage was a result of [name] having a decrease in appetite and to assist  with [name]'s pain management.   CSW offered [name] substance abuse resources and refrrals and [name] declined. [name] reported that [name] does not have a substance abuse problem.  [name] reported [name]'s last use of marijuana was on 04/23/16. [name] was made aware that [place] per hospital policy. CSW informed  [name]  that a CPS worker will contact [name] within 48hrs. [name] verbalized an understanding of the hospital's policy.  [name] did not have any further questions or concerns.  CSW will follow-up weekly with [name] while infant is in NICU.     CSW Plan/Description:  Patient/Family Education , No Further Intervention Required/No Barriers to Discharge, Child Protective Service Report    CSW made CPS report to assessment worker, Robin [name].    Robin [name], MSW, LCSW Clinical Social Work [phone]

## 2016-05-14 ENCOUNTER — Encounter: Payer: Self-pay | Admitting: Advanced Practice Midwife

## 2016-05-14 ENCOUNTER — Other Ambulatory Visit: Payer: Self-pay | Admitting: Advanced Practice Midwife

## 2016-05-14 NOTE — Progress Notes (Addendum)
    Delivery Note  04/19/16  After a 2 minute 2nd stage, at 0739  a viable girl was delivered precipitiously by RN.  I arrived as the body was being delivered. APGAR: 7 at 5 minutes ; weight pending. NICU called to attend to baby d/t IUGR. 40 units of pitocin diluted in 1000cc LR was infused rapidly IV.  After 2 minutes, the cord was clamped and cut. 40 units of pitocin diluted in 1000cc LR was infused rapidly IV.  The placenta separated spontaneously and delivered via CCT and maternal pushing effort.  It was inspected and appears to be intact with a 3 VC.  There were the following complications:   Anesthesia: Epidural  Episiotomy: none Lacerations: none Suture Repair: n/a Est. Blood Loss (mL): <500cc  Mom to postpartum.  Baby to NICU.

## 2016-05-17 ENCOUNTER — Emergency Department (HOSPITAL_COMMUNITY)
Admission: EM | Admit: 2016-05-17 | Discharge: 2016-05-17 | Disposition: A | Discharge status: Home / Self Care | Payer: Medicaid Other | Attending: Emergency Medicine | Admitting: Emergency Medicine

## 2016-05-17 ENCOUNTER — Encounter (HOSPITAL_COMMUNITY): Payer: Self-pay

## 2016-05-17 DIAGNOSIS — W57XXXA Bitten or stung by nonvenomous insect and other nonvenomous arthropods, initial encounter: Secondary | ICD-10-CM | POA: Insufficient documentation

## 2016-05-17 DIAGNOSIS — Y999 Unspecified external cause status: Secondary | ICD-10-CM | POA: Insufficient documentation

## 2016-05-17 DIAGNOSIS — Z79899 Other long term (current) drug therapy: Secondary | ICD-10-CM | POA: Insufficient documentation

## 2016-05-17 DIAGNOSIS — Y929 Unspecified place or not applicable: Secondary | ICD-10-CM | POA: Diagnosis not present

## 2016-05-17 DIAGNOSIS — Y939 Activity, unspecified: Secondary | ICD-10-CM | POA: Diagnosis not present

## 2016-05-17 DIAGNOSIS — F17210 Nicotine dependence, cigarettes, uncomplicated: Secondary | ICD-10-CM | POA: Diagnosis not present

## 2016-05-17 DIAGNOSIS — J45909 Unspecified asthma, uncomplicated: Secondary | ICD-10-CM | POA: Insufficient documentation

## 2016-05-17 DIAGNOSIS — S40862A Insect bite (nonvenomous) of left upper arm, initial encounter: Secondary | ICD-10-CM | POA: Insufficient documentation

## 2016-05-17 DIAGNOSIS — L03114 Cellulitis of left upper limb: Secondary | ICD-10-CM | POA: Insufficient documentation

## 2016-05-17 DIAGNOSIS — R21 Rash and other nonspecific skin eruption: Secondary | ICD-10-CM | POA: Diagnosis present

## 2016-05-17 DIAGNOSIS — T7840XA Allergy, unspecified, initial encounter: Secondary | ICD-10-CM | POA: Insufficient documentation

## 2016-05-17 MED ORDER — CEPHALEXIN 500 MG PO CAPS: 500.0000 mg | ORAL_CAPSULE | Freq: Four times a day (QID) | ORAL | 0 refills | Status: DC

## 2016-05-17 MED ORDER — TRIAMCINOLONE ACETONIDE 0.025 % EX OINT: 1.0000 "application " | TOPICAL_OINTMENT | Freq: Two times a day (BID) | CUTANEOUS | 1 refills | Status: DC

## 2016-05-17 NOTE — ED Triage Notes (Signed)
Patient c/o rash and redness on bilateral arms and 5th right toes that started yesterday. Patient states she has severe itching. Patient has not taken anything for itching.

## 2016-05-17 NOTE — Discharge Instructions (Signed)
° °  HOME CARE INSTRUCTIONS  °Take your antibiotics as directed. Finish them even if you start to feel better. °Keep the infected arm or leg elevated to reduce swelling. °Apply a warm cloth to the affected area up to 4 times per day to relieve pain. °Only take over-the-counter or prescription medicines for pain, discomfort, or fever as directed by your caregiver. °Keep all follow-up appointments as directed by your caregiver. °SEEK MEDICAL CARE IF:  °You notice red streaks coming from the infected area. °Your red area gets larger or turns dark in color. °Your bone or joint underneath the infected area becomes painful after the skin has healed. °Your infection returns in the same area or another area. °You notice a swollen bump in the infected area. °You develop new symptoms. °SEEK IMMEDIATE MEDICAL CARE IF:  °You have a fever. °You feel very sleepy. °You develop vomiting or diarrhea. °You have a general ill feeling (malaise) with muscle aches and pains. ° °

## 2016-05-17 NOTE — ED Provider Notes (Signed)
[facility]-EMERGENCY DEPT Provider Note   CSN: [id number] Arrival date & time: 05/17/16  1705  By signing my name below, I, Avnee Patel, attest that this documentation has been prepared under the direction and in the presence of  Arthor CaptainAbigail Junetta Hearn, PA-C. Electronically Signed: Clovis PuAvnee Patel, ED Scribe. 05/17/16. 6:35 PM.    History   Chief Complaint Chief Complaint  Patient presents with  . Rash  . Insect Bite     The history is provided by the patient. No language interpreter was used.   HPI Comments:  Robin [name] is a [age] y.o. female who presents to the Emergency Department complaining of multiple insect bites over her body which began yesterday. Pt notes these bites have been severely itching. She states she lives in her grandmas house. She denies any itching to her face, fevers or chills. Pt notes she just bought new soap. No alleviating factors noted. Pt denies any other complaints at this time.   Past Medical History:  Diagnosis Date  . Asthma   . Preeclampsia     Patient Active Problem List   Diagnosis Date Noted  . Gestational hypertension 04/01/2016  . IUGR (intrauterine growth restriction) affecting care of mother 03/14/2016  . Obesity affecting pregnancy in third trimester, antepartum 02/29/2016  . Chlamydia infection affecting pregnancy 02/29/2016  . Oligohydramnios antepartum 02/21/2016  . Supervision of high risk pregnancy, antepartum 12/05/2015    Past Surgical History:  Procedure Laterality Date  . NO PAST SURGERIES      OB History    Gravida Para Term Preterm AB Living   3 2 1 1 1 2    SAB TAB Ectopic Multiple Live Births   1 0 0 0 2       Home Medications    Prior to Admission medications   Medication Sig Start Date End Date Taking? Authorizing Provider  acetaminophen (TYLENOL) 325 MG tablet Take 650 mg by mouth every 6 (six) hours as needed for mild pain or headache. Reported on 03/19/2016    Historical Provider, MD  amLODipine (NORVASC) 10 MG  tablet Take 1 tablet (10 mg total) by mouth daily. 04/19/16   Tilda BurrowJohn V Ferguson, MD  Prenatal Vit-Fe Fumarate-FA (MULTIVITAMIN-PRENATAL) 27-0.8 MG TABS tablet Take 1 tablet by mouth daily at 12 noon. 01/09/16   Hurshel PartyLisa A Leftwich-Kirby, CNM    Family History Family History  Problem Relation Age of Onset  . Cancer Maternal Aunt   . Cancer Paternal Grandmother     Social History Social History  Substance Use Topics  . Smoking status: Current Every Day Smoker    Packs/day: 0.25    Types: Cigarettes  . Smokeless tobacco: Never Used  . Alcohol use No     Comment: occasionally not since pregnancy     Allergies   Review of patient's allergies indicates no known allergies.   Review of Systems Review of Systems  Constitutional: Negative for chills and fever.  Skin: Positive for rash.  All other systems reviewed and are negative.    Physical Exam Updated Vital Signs BP 138/98 (BP Location: Left Arm)   Pulse 102   Resp 20   Ht 5' (1.524 m)   Wt 225 lb (102.1 kg)   SpO2 98%   BMI 43.94 kg/m   Physical Exam  Constitutional: She is oriented to person, place, and time. She appears well-developed and well-nourished. No distress.  HENT:  Head: Normocephalic and atraumatic.  Eyes: Conjunctivae are normal. No scleral icterus.  Neck: Normal range of motion.  Cardiovascular: Normal rate, regular rhythm and normal heart sounds.  Exam reveals no gallop and no friction rub.   No murmur heard. Pulmonary/Chest: Effort normal and breath sounds normal. No respiratory distress.  Abdominal: Soft. Bowel sounds are normal. She exhibits no distension and no mass. There is no tenderness. There is no guarding.  Neurological: She is alert and oriented to person, place, and time.  Skin: Skin is warm and dry. She is not diaphoretic.  Multiple very large erythematous wheals of the bilateral forearms and chest. Central puncta suggestive of insect bite. On the left medial arm. There is a large, tender,  we'll with crusting centrally, exquisitely tender to palpation and erythematous.      ED Treatments / Results  DIAGNOSTIC STUDIES:  Oxygen Saturation is 98% on RA, normal by my interpretation.    COORDINATION OF CARE:  6:21 PM Discussed treatment plan with pt at bedside and pt agreed to plan.  Labs (all labs ordered are listed, but only abnormal results are displayed) Labs Reviewed - No data to display  EKG  EKG Interpretation None       Radiology No results found.  Procedures Procedures (including critical care time)  Medications Ordered in ED Medications - No data to display   Initial Impression / Assessment and Plan / ED Course  I have reviewed the triage vital signs and the nursing notes.  Pertinent labs & imaging results that were available during my care of the patient were reviewed by me and considered in my medical decision making (see chart for details).  Clinical Course   Pt has multiple insect bites with allergic reaction in area what appear to be cellulitis in RUE. Will treat with topical kenalog and keflex for 1 week. Pt advised that she should not breastfeed during this time. I consulted with main pharmacist for a low absorption profile and short half life of keflex. Follow up in 2 days if symptoms are worsening.    Final Clinical Impressions(s) / ED Diagnoses   Final diagnoses:  None    New Prescriptions New Prescriptions   No medications on file  I personally performed the services described in this documentation, which was scribed in my presence. The recorded information has been reviewed and is accurate.       Arthor Captain, PA-C 05/17/16 1845    Alvira Monday, MD 05/19/16 [phone]

## 2016-06-11 ENCOUNTER — Ambulatory Visit (INDEPENDENT_AMBULATORY_CARE_PROVIDER_SITE_OTHER): Payer: Medicaid Other | Admitting: Advanced Practice Midwife

## 2016-06-11 ENCOUNTER — Ambulatory Visit: Payer: Self-pay | Admitting: Clinical

## 2016-06-11 DIAGNOSIS — Z3009 Encounter for other general counseling and advice on contraception: Secondary | ICD-10-CM

## 2016-06-11 NOTE — BH Specialist Note (Signed)
Error

## 2016-06-11 NOTE — Patient Instructions (Signed)
Contraception Choices Contraception (birth control) is the use of any methods or devices to prevent pregnancy. Below are some methods to help avoid pregnancy. HORMONAL METHODS   Contraceptive implant. This is a thin, plastic tube containing progesterone hormone. It does not contain estrogen hormone. Your health care provider inserts the tube in the inner part of the upper arm. The tube can remain in place for up to 3 years. After 3 years, the implant must be removed. The implant prevents the ovaries from releasing an egg (ovulation), thickens the cervical mucus to prevent sperm from entering the uterus, and thins the lining of the inside of the uterus.  Progesterone-only injections. These injections are given every 3 months by your health care provider to prevent pregnancy. This synthetic progesterone hormone stops the ovaries from releasing eggs. It also thickens cervical mucus and changes the uterine lining. This makes it harder for sperm to survive in the uterus.  Birth control pills. These pills contain estrogen and progesterone hormone. They work by preventing the ovaries from releasing eggs (ovulation). They also cause the cervical mucus to thicken, preventing the sperm from entering the uterus. Birth control pills are prescribed by a health care provider.Birth control pills can also be used to treat heavy periods.  Minipill. This type of birth control pill contains only the progesterone hormone. They are taken every day of each month and must be prescribed by your health care provider.  Birth control patch. The patch contains hormones similar to those in birth control pills. It must be changed once a week and is prescribed by a health care provider.  Vaginal ring. The ring contains hormones similar to those in birth control pills. It is left in the vagina for 3 weeks, removed for 1 week, and then a new one is put back in place. The patient must be comfortable inserting and removing the ring  from the vagina.A health care provider's prescription is necessary.  Emergency contraception. Emergency contraceptives prevent pregnancy after unprotected sexual intercourse. This pill can be taken right after sex or up to 5 days after unprotected sex. It is most effective the sooner you take the pills after having sexual intercourse. Most emergency contraceptive pills are available without a prescription. Check with your pharmacist. Do not use emergency contraception as your only form of birth control. BARRIER METHODS   Female condom. This is a thin sheath (latex or rubber) that is worn over the penis during sexual intercourse. It can be used with spermicide to increase effectiveness.  Female condom. This is a soft, loose-fitting sheath that is put into the vagina before sexual intercourse.  Diaphragm. This is a soft, latex, dome-shaped barrier that must be fitted by a health care provider. It is inserted into the vagina, along with a spermicidal jelly. It is inserted before intercourse. The diaphragm should be left in the vagina for 6 to 8 hours after intercourse.  Cervical cap. This is a round, soft, latex or plastic cup that fits over the cervix and must be fitted by a health care provider. The cap can be left in place for up to 48 hours after intercourse.  Sponge. This is a soft, circular piece of polyurethane foam. The sponge has spermicide in it. It is inserted into the vagina after wetting it and before sexual intercourse.  Spermicides. These are chemicals that kill or block sperm from entering the cervix and uterus. They come in the form of creams, jellies, suppositories, foam, or tablets. They do not require a   prescription. They are inserted into the vagina with an applicator before having sexual intercourse. The process must be repeated every time you have sexual intercourse. INTRAUTERINE CONTRACEPTION  Intrauterine device (IUD). This is a T-shaped device that is put in a woman's uterus  during a menstrual period to prevent pregnancy. There are 2 types:  Copper IUD. This type of IUD is wrapped in copper wire and is placed inside the uterus. Copper makes the uterus and fallopian tubes produce a fluid that kills sperm. It can stay in place for 10 years.  Hormone IUD. This type of IUD contains the hormone progestin (synthetic progesterone). The hormone thickens the cervical mucus and prevents sperm from entering the uterus, and it also thins the uterine lining to prevent implantation of a fertilized egg. The hormone can weaken or kill the sperm that get into the uterus. It can stay in place for 3-5 years, depending on which type of IUD is used. PERMANENT METHODS OF CONTRACEPTION  Female tubal ligation. This is when the woman's fallopian tubes are surgically sealed, tied, or blocked to prevent the egg from traveling to the uterus.  Hysteroscopic sterilization. This involves placing a small coil or insert into each fallopian tube. Your doctor uses a technique called hysteroscopy to do the procedure. The device causes scar tissue to form. This results in permanent blockage of the fallopian tubes, so the sperm cannot fertilize the egg. It takes about 3 months after the procedure for the tubes to become blocked. You must use another form of birth control for these 3 months.  Female sterilization. This is when the female has the tubes that carry sperm tied off (vasectomy).This blocks sperm from entering the vagina during sexual intercourse. After the procedure, the man can still ejaculate fluid (semen). NATURAL PLANNING METHODS  Natural family planning. This is not having sexual intercourse or using a barrier method (condom, diaphragm, cervical cap) on days the woman could become pregnant.  Calendar method. This is keeping track of the length of each menstrual cycle and identifying when you are fertile.  Ovulation method. This is avoiding sexual intercourse during ovulation.  Symptothermal  method. This is avoiding sexual intercourse during ovulation, using a thermometer and ovulation symptoms.  Post-ovulation method. This is timing sexual intercourse after you have ovulated. Regardless of which type or method of contraception you choose, it is important that you use condoms to protect against the transmission of sexually transmitted infections (STIs). Talk with your health care provider about which form of contraception is most appropriate for you.   This information is not intended to replace advice given to you by your health care provider. Make sure you discuss any questions you have with your health care provider.   Document Released: 08/19/2005 Document Revised: 08/24/2013 Document Reviewed: 02/11/2013 Elsevier Interactive Patient Education 2016 Elsevier Inc.  

## 2016-06-11 NOTE — Progress Notes (Signed)
Subjective:     Robin [name] is a 25 y.o. female who presents for a postpartum visit. She is 7 weeks postpartum following a vaginal delivery after IOL for IUGR at 36 weeks. I have fully reviewed the prenatal and intrapartum course. The delivery was at 36 gestational weeks. Outcome: spontaneous vaginal delivery. Anesthesia: epidural. Postpartum course has been normal. Baby's course has been normal. Baby is feeding by bottle - Similac Neosure. Bleeding no bleeding. Bowel function is normal. Bladder function is normal. Patient is sexually active. Contraception method is none. Postpartum depression screening: negative.  The following portions of the patient's history were reviewed and updated as appropriate: allergies, current medications, past family history, past medical history, past social history, past surgical history and problem list.  Review of Systems A comprehensive review of systems was negative.   Objective:    BP (!) 144/82   Pulse 84   Wt 292 lb (132.5 kg)   BMI 57.03 kg/m   VS reviewed, nursing note reviewed,  Constitutional: well developed, well nourished, no distress HEENT: normocephalic CV: normal rate Pulm/chest wall: normal effort Abdomen: soft Neuro: alert and oriented x 3 Skin: warm, dry Psych: affect normal Pelvic exam deferred  Assessment:       1. Postpartum examination following vaginal delivery --Pt denies h/a, epigastric pain, visual disturbances.  Recheck BP in 2 weeks at next visit.  2. Encounter for counseling regarding contraception --Discussed LARCs as most effective forms of birth control.  Pt planning Depo because she likes not having periods but after discussing benefits/risks/options she would like to get IUD.  She had unprotected sex yesterday so will use condoms x 2 weeks and return to office for IUD placement at that time.  Plan:    1. Contraception: condoms 2. Pt to see Asher MuirJamie at visit in 2 weeks to discuss housing options. 3. Follow up  in: 2 weeks or as needed.

## 2016-06-12 NOTE — Progress Notes (Signed)
Erroneous encounter

## 2016-07-01 ENCOUNTER — Ambulatory Visit: Payer: Self-pay

## 2016-07-01 ENCOUNTER — Ambulatory Visit: Payer: Self-pay | Admitting: Obstetrics and Gynecology

## 2016-07-03 ENCOUNTER — Ambulatory Visit: Payer: Self-pay

## 2016-10-02 ENCOUNTER — Telehealth: Payer: Self-pay | Admitting: *Deleted

## 2016-10-02 NOTE — Telephone Encounter (Addendum)
Called pt and left message stating that I am calling regarding her appt in our office tomorrow. Please call back and leave a message stating whether additional detailed information can be left on her voice mail. Per review, pt has appt 2/1 @ 1330 to begin Depo Provera injections. Pt needs to be aware that she should have abstained from sex x2 weeks and have negative pregnancy test in office in order to receive the injection tomorrow. If she has not abstained, she will need to do so and re-schedule the appt for injection.    2/1 1453  Pt did not return my call or keep appt today. This will be addressed if she calls to reschedule appt for Depo Provera.

## 2016-10-03 ENCOUNTER — Ambulatory Visit: Payer: Self-pay

## 2017-01-23 ENCOUNTER — Emergency Department (HOSPITAL_COMMUNITY): Payer: Self-pay

## 2017-01-23 ENCOUNTER — Encounter (HOSPITAL_COMMUNITY): Payer: Self-pay | Admitting: Emergency Medicine

## 2017-01-23 ENCOUNTER — Emergency Department (HOSPITAL_COMMUNITY)
Admission: EM | Admit: 2017-01-23 | Discharge: 2017-01-23 | Disposition: A | Discharge status: Home / Self Care | Payer: Self-pay | Attending: Emergency Medicine | Admitting: Emergency Medicine

## 2017-01-23 DIAGNOSIS — J45909 Unspecified asthma, uncomplicated: Secondary | ICD-10-CM | POA: Insufficient documentation

## 2017-01-23 DIAGNOSIS — S0083XA Contusion of other part of head, initial encounter: Secondary | ICD-10-CM | POA: Insufficient documentation

## 2017-01-23 DIAGNOSIS — H1132 Conjunctival hemorrhage, left eye: Secondary | ICD-10-CM | POA: Insufficient documentation

## 2017-01-23 DIAGNOSIS — Y9289 Other specified places as the place of occurrence of the external cause: Secondary | ICD-10-CM | POA: Insufficient documentation

## 2017-01-23 DIAGNOSIS — Z23 Encounter for immunization: Secondary | ICD-10-CM | POA: Insufficient documentation

## 2017-01-23 DIAGNOSIS — Y999 Unspecified external cause status: Secondary | ICD-10-CM | POA: Insufficient documentation

## 2017-01-23 DIAGNOSIS — F17210 Nicotine dependence, cigarettes, uncomplicated: Secondary | ICD-10-CM | POA: Insufficient documentation

## 2017-01-23 DIAGNOSIS — Y9389 Activity, other specified: Secondary | ICD-10-CM | POA: Insufficient documentation

## 2017-01-23 MED ORDER — TETANUS-DIPHTH-ACELL PERTUSSIS 5-2.5-18.5 LF-MCG/0.5 IM SUSP
INTRAMUSCULAR | Status: AC
  Administered 2017-01-23: 0.5 mL via INTRAMUSCULAR
  Filled 2017-01-23: qty 0.5

## 2017-01-23 MED ORDER — HYDROCODONE-ACETAMINOPHEN 5-325 MG PO TABS
2.0000 | ORAL_TABLET | Freq: Once | ORAL | Status: AC
  Administered 2017-01-23: 2 via ORAL
  Filled 2017-01-23: qty 2

## 2017-01-23 MED ORDER — HYDROCODONE-ACETAMINOPHEN 5-325 MG PO TABS: 2.0000 | ORAL_TABLET | ORAL | 0 refills | Status: DC | PRN

## 2017-01-23 MED ORDER — TETANUS-DIPHTHERIA TOXOIDS TD 5-2 LFU IM INJ
0.5000 mL | INJECTION | Freq: Once | INTRAMUSCULAR | Status: DC
  Filled 2017-01-23: qty 0.5

## 2017-01-23 NOTE — Discharge Instructions (Signed)
Return if any problems.  Ice to areas of pain  
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
## 2017-09-26 ENCOUNTER — Encounter: Payer: Medicaid Other | Admitting: Maternal Newborn

## 2017-10-02 ENCOUNTER — Other Ambulatory Visit (INDEPENDENT_AMBULATORY_CARE_PROVIDER_SITE_OTHER): Payer: Medicaid Other

## 2017-10-02 ENCOUNTER — Ambulatory Visit (INDEPENDENT_AMBULATORY_CARE_PROVIDER_SITE_OTHER): Payer: Medicaid Other | Admitting: Advanced Practice Midwife

## 2017-10-02 ENCOUNTER — Encounter: Payer: Self-pay | Admitting: Advanced Practice Midwife

## 2017-10-02 VITALS — BP 128/84 | Ht 61.0 in | Wt 272.0 lb

## 2017-10-02 DIAGNOSIS — O0990 Supervision of high risk pregnancy, unspecified, unspecified trimester: Secondary | ICD-10-CM

## 2017-10-02 DIAGNOSIS — Z8759 Personal history of other complications of pregnancy, childbirth and the puerperium: Secondary | ICD-10-CM

## 2017-10-02 DIAGNOSIS — Z131 Encounter for screening for diabetes mellitus: Secondary | ICD-10-CM

## 2017-10-02 DIAGNOSIS — Z113 Encounter for screening for infections with a predominantly sexual mode of transmission: Secondary | ICD-10-CM

## 2017-10-02 DIAGNOSIS — O99210 Obesity complicating pregnancy, unspecified trimester: Secondary | ICD-10-CM

## 2017-10-02 NOTE — Progress Notes (Signed)
NOB today. No vb. No lof ?
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
## 2017-10-02 NOTE — Patient Instructions (Signed)
Prenatal Care WHAT IS PRENATAL CARE? Prenatal care is the process of caring for a pregnant woman before she gives birth. Prenatal care makes sure that she and her baby remain as healthy as possible throughout pregnancy. Prenatal care may be provided by a midwife, family practice health care provider, or a childbirth and pregnancy specialist (obstetrician). Prenatal care may include physical examinations, testing, treatments, and education on nutrition, lifestyle, and social support services. WHY IS PRENATAL CARE SO IMPORTANT? Early and consistent prenatal care increases the chance that you and your baby will remain healthy throughout your pregnancy. This type of care also decreases a baby's risk of being born too early (prematurely), or being born smaller than expected (small for gestational age). Any underlying medical conditions you may have that could pose a risk during your pregnancy are discussed during prenatal care visits. You will also be monitored regularly for any new conditions that may arise during your pregnancy so they can be treated quickly and effectively. WHAT HAPPENS DURING PRENATAL CARE VISITS? Prenatal care visits may include the following: Discussion Tell your health care provider about any new signs or symptoms you have experienced since your last visit. These might include:  Nausea or vomiting.  Increased or decreased level of energy.  Difficulty sleeping.  Back or leg pain.  Weight changes.  Frequent urination.  Shortness of breath with physical activity.  Changes in your skin, such as the development of a rash or itchiness.  Vaginal discharge or bleeding.  Feelings of excitement or nervousness.  Changes in your baby's movements.  You may want to write down any questions or topics you want to discuss with your health care provider and bring them with you to your appointment. Examination During your first prenatal care visit, you will likely have a complete  physical exam. Your health care provider will often examine your vagina, cervix, and the position of your uterus, as well as check your heart, lungs, and other body systems. As your pregnancy progresses, your health care provider will measure the size of your uterus and your baby's position inside your uterus. He or she may also examine you for early signs of labor. Your prenatal visits may also include checking your blood pressure and, after about 10-12 weeks of pregnancy, listening to your baby's heartbeat. Testing Regular testing often includes:  Urinalysis. This checks your urine for glucose, protein, or signs of infection.  Blood count. This checks the levels of white and red blood cells in your body.  Tests for sexually transmitted infections (STIs). Testing for STIs at the beginning of pregnancy is routinely done and is required in many states.  Antibody testing. You will be checked to see if you are immune to certain illnesses, such as rubella, that can affect a developing fetus.  Glucose screen. Around 24-28 weeks of pregnancy, your blood glucose level will be checked for signs of gestational diabetes. Follow-up tests may be recommended.  Group B strep. This is a bacteria that is commonly found inside a woman's vagina. This test will inform your health care provider if you need an antibiotic to reduce the amount of this bacteria in your body prior to labor and childbirth.  Ultrasound. Many pregnant women undergo an ultrasound screening around 18-20 weeks of pregnancy to evaluate the health of the fetus and check for any developmental abnormalities.  HIV (human immunodeficiency virus) testing. Early in your pregnancy, you will be screened for HIV. If you are at high risk for HIV, this test may   be repeated during your third trimester of pregnancy.  You may be offered other testing based on your age, personal or family medical history, or other factors. HOW OFTEN SHOULD I PLAN TO SEE MY  HEALTH CARE PROVIDER FOR PRENATAL CARE? Your prenatal care check-up schedule depends on any medical conditions you have before, or develop during, your pregnancy. If you do not have any underlying medical conditions, you will likely be seen for checkups:  Monthly, during the first 6 months of pregnancy.  Twice a month during months 7 and 8 of pregnancy.  Weekly starting in the 9th month of pregnancy and until delivery.  If you develop signs of early labor or other concerning signs or symptoms, you may need to see your health care provider more often. Ask your health care provider what prenatal care schedule is best for you. WHAT CAN I DO TO KEEP MYSELF AND MY BABY AS HEALTHY AS POSSIBLE DURING MY PREGNANCY?  Take a prenatal vitamin containing 400 micrograms (0.4 mg) of folic acid every day. Your health care provider may also ask you to take additional vitamins such as iodine, vitamin D, iron, copper, and zinc.  Take 1500-2000 mg of calcium daily starting at your 20th week of pregnancy until you deliver your baby.  Make sure you are up to date on your vaccinations. Unless directed otherwise by your health care provider: ? You should receive a tetanus, diphtheria, and pertussis (Tdap) vaccination between the 27th and 36th week of your pregnancy, regardless of when your last Tdap immunization occurred. This helps protect your baby from whooping cough (pertussis) after he or she is born. ? You should receive an annual inactivated influenza vaccine (IIV) to help protect you and your baby from influenza. This can be done at any point during your pregnancy.  Eat a well-rounded diet that includes: ? Fresh fruits and vegetables. ? Lean proteins. ? Calcium-rich foods such as milk, yogurt, hard cheeses, and dark, leafy greens. ? Whole grain breads.  Do noteat seafood high in mercury, including: ? Swordfish. ? Tilefish. ? Shark. ? King mackerel. ? More than 6 oz tuna per week.  Do not  eat: ? Raw or undercooked meats or eggs. ? Unpasteurized foods, such as soft cheeses (brie, blue, or feta), juices, and milks. ? Lunch meats. ? Hot dogs that have not been heated until they are steaming.  Drink enough water to keep your urine clear or pale yellow. For many women, this may be 10 or more 8 oz glasses of water each day. Keeping yourself hydrated helps deliver nutrients to your baby and may prevent the start of pre-term uterine contractions.  Do not use any tobacco products including cigarettes, chewing tobacco, or electronic cigarettes. If you need help quitting, ask your health care provider.  Do not drink beverages containing alcohol. No safe level of alcohol consumption during pregnancy has been determined.  Do not use any illegal drugs. These can harm your developing baby or cause a miscarriage.  Ask your health care provider or pharmacist before taking any prescription or over-the-counter medicines, herbs, or supplements.  Limit your caffeine intake to no more than 200 mg per day.  Exercise. Unless told otherwise by your health care provider, try to get 30 minutes of moderate exercise most days of the week. Do not  do high-impact activities, contact sports, or activities with a high risk of falling, such as horseback riding or downhill skiing.  Get plenty of rest.  Avoid anything that raises your   body temperature, such as hot tubs and saunas.  If you own a cat, do not empty its litter box. Bacteria contained in cat feces can cause an infection called toxoplasmosis. This can result in serious harm to the fetus.  Stay away from chemicals such as insecticides, lead, mercury, and cleaning or paint products that contain solvents.  Do not have any X-rays taken unless medically necessary.  Take a childbirth and breastfeeding preparation class. Ask your health care provider if you need a referral or recommendation.  This information is not intended to replace advice given  to you by your health care provider. Make sure you discuss any questions you have with your health care provider. Document Released: 08/22/2003 Document Revised: 01/22/2016 Document Reviewed: 11/03/2013 [employer] Interactive Patient Education  2017 [employer] Exercise During Pregnancy For people of all ages, exercise is an important part of being healthy. Exercise improves heart and lung function and helps to maintain strength, flexibility, and a healthy body weight. Exercise also boosts energy levels and elevates mood. For most women, maintaining an exercise routine throughout pregnancy is recommended. It is only on rare occasions and with certain medical conditions or pregnancy complications that women may be asked to limit or avoid exercise during pregnancy. What are some other benefits to exercising during pregnancy? Along with maintaining strength and flexibility, exercising throughout pregnancy can help to:  Keep strength in muscles that are very important during labor and childbirth.  Decrease low back pain during pregnancy.  Decrease the risk of developing gestational diabetes mellitus (GDM).  Improve blood sugar (glucose) control for women who have GDM.  Decrease the risk of developing preeclampsia. This is a serious condition that causes high blood pressure along with other symptoms, such as swelling and headaches.  Decrease the risk of cesarean delivery.  Speed up the recovery after giving birth.  How often should I exercise? Unless your health care provider gives you different instructions, you should try to exercise on most days or all days of the week. In general, try to exercise with moderate intensity for about 150 minutes per week. This can be spread out across several days, such as exercising for 30 minutes per day on 5 days of each week. You can tell that you are exercising at a moderate intensity if you have a higher heart rate and faster breathing, but you are still  able to hold a conversation. What types of moderate-intensity exercise are recommended during pregnancy? There are many types of exercise that are safe for you to do during pregnancy. Unless your health care provider gives you different instructions, do a variety of exercises that safely increase your heart and breathing (cardiopulmonary) rates and help you to build and maintain muscle strength (strength training). You should always be able to talk in full sentences while exercising during pregnancy. Some examples of exercising that is safe to do during pregnancy include:  Brisk walking or hiking.  Swimming.  Water aerobics.  Riding a stationary bike.  Strength training.  Modified yoga or Pilates. Tell your instructor that you are pregnant. Avoid overstretching and avoid lying on your back for long periods of time.  Running or jogging. Only choose this type of exercise if: ? You ran or jogged regularly before your pregnancy. ? You can run or jog and still talk in complete sentences.  What types of exercise should I not do during pregnancy? Depending on your level of fitness and whether you exercised regularly before your pregnancy, you may be   advised to limit vigorous-intensity exercise during your pregnancy. You can tell that you are exercising at a vigorous intensity if you are breathing much harder and faster and cannot hold a conversation while exercising. Some examples of exercising that you should avoid during pregnancy include:  Contact sports.  Activities that place you at risk for falling on or being hit in the belly, such as downhill skiing, water skiing, surfing, rock climbing, cycling, gymnastics, and horseback riding.  Scuba diving.  Sky diving.  Yoga or Pilates in a room that is heated to extreme temperatures ("hot yoga" or "hot Pilates").  Jogging or running, unless you ran or jogged regularly before your pregnancy. While jogging or running, you should always be able  to talk in full sentences. Do not run or jog so vigorously that you are unable to have a conversation.  If you are not used to exercising at elevation (more than 6,000 feet above sea level), do not do so during your pregnancy.  When should I avoid exercising during pregnancy? Certain medical conditions can make it unsafe to exercise during pregnancy, or they may increase your risk of miscarriage or early labor and birth. Some of these conditions include:  Some types of heart disease.  Some types of lung disease.  Placenta previa. This is when the placenta partially or completely covers the opening of the uterus (cervix).  Frequent bleeding from the vagina during your pregnancy.  Incompetent cervix. This is when your cervix does not remain as tightly closed during pregnancy as it should.  Premature labor.  Ruptured membranes. This is when the protective sac (amniotic sac) opens up and amniotic fluid leaks from your vagina.  Severely low blood count (anemia).  Preeclampsia or pregnancy-caused high blood pressure.  Carrying more than one baby (multiple gestation) and having an additional risk of early labor.  Poorly controlled diabetes.  Being severely underweight or severely overweight.  Intrauterine growth restriction. This is when your baby's growth and development during pregnancy are slower than expected.  Other medical conditions. Ask your health care provider if any apply to you.  What else should I know about exercising during pregnancy? You should take these precautions while exercising during pregnancy:  Avoid overheating. ? Wear loose-fitting, breathable clothes. ? Do not exercise in very high temperatures.  Avoid dehydration. Drink enough water before, during, and after exercise to keep your urine clear or pale yellow.  Avoid overstretching. Because of hormone changes during pregnancy, it is easy to overstretch muscles, tendons, and ligaments during  pregnancy.  Start slowly and ask your health care provider to recommend types of exercise that are safe for you, if exercising regularly is new for you.  Pregnancy is not a time for exercising to lose weight. When should I seek medical care? You should stop exercising and call your health care provider if you have any unusual symptoms, such as:  Mild uterine contractions or abdominal cramping.  Dizziness that does not improve with rest.  When should I seek immediate medical care? You should stop exercising and call your local emergency services (911 in the [place] if you have any unusual symptoms, such as:  Sudden, severe pain in your low back or your belly.  Uterine contractions or abdominal cramping that do not improve with rest.  Chest pain.  Bleeding or fluid leaking from your vagina.  Shortness of breath.  This information is not intended to replace advice given to you by your health care provider. Make sure you discuss any   questions you have with your health care provider. Document Released: 08/19/2005 Document Revised: 01/17/2016 Document Reviewed: 10/27/2014 [employer] Interactive Patient Education  2018 [employer] Eating Plan for Pregnant Women While you are pregnant, your body will require additional nutrition to help support your growing baby. It is recommended that you consume:  150 additional calories each day during your first trimester.  300 additional calories each day during your second trimester.  300 additional calories each day during your third trimester.  Eating a healthy, well-balanced diet is very important for your health and for your baby's health. You also have a higher need for some vitamins and minerals, such as folic acid, calcium, iron, and vitamin D. What do I need to know about eating during pregnancy?  Do not try to lose weight or go on a diet during pregnancy.  Choose healthy, nutritious foods. Choose  of a sandwich with a glass of milk  instead of a candy bar or a high-calorie sugar-sweetened beverage.  Limit your overall intake of foods that have "empty calories." These are foods that have little nutritional value, such as sweets, desserts, candies, sugar-sweetened beverages, and fried foods.  Eat a variety of foods, especially fruits and vegetables.  Take a prenatal vitamin to help meet the additional needs during pregnancy, specifically for folic acid, iron, calcium, and vitamin D.  Remember to stay active. Ask your health care provider for exercise recommendations that are specific to you.  Practice good food safety and cleanliness, such as washing your hands before you eat and after you prepare raw meat. This helps to prevent foodborne illnesses, such as listeriosis, that can be very dangerous for your baby. Ask your health care provider for more information about listeriosis. What does 150 extra calories look like? Healthy options for an additional 150 calories each day could be any of the following:  Plain low-fat yogurt (6-8 oz) with  cup of berries.  1 apple with 2 teaspoons of peanut butter.  Cut-up vegetables with  cup of hummus.  Low-fat chocolate milk (8 oz or 1 cup).  1 string cheese with 1 medium orange.   of a peanut butter and jelly sandwich on whole-wheat bread (1 tsp of peanut butter).  For 300 calories, you could eat two of those healthy options each day. What is a healthy amount of weight to gain? The recommended amount of weight for you to gain is based on your pre-pregnancy BMI. If your pre-pregnancy BMI was:  Less than 18 (underweight), you should gain 28-40 lb.  18-24.9 (normal), you should gain 25-35 lb.  25-29.9 (overweight), you should gain 15-25 lb.  Greater than 30 (obese), you should gain 11-20 lb.  What if I am having twins or multiples? Generally, pregnant women who will be having twins or multiples may need to increase their daily calories by 300-600 calories each day. The  recommended range for total weight gain is 25-54 lb, depending on your pre-pregnancy BMI. Talk with your health care provider for specific guidance about additional nutritional needs, weight gain, and exercise during your pregnancy. What foods can I eat? Grains Any grains. Try to choose whole grains, such as whole-wheat bread, oatmeal, or brown rice. Vegetables Any vegetables. Try to eat a variety of colors and types of vegetables to get a full range of vitamins and minerals. Remember to wash your vegetables well before eating. Fruits Any fruits. Try to eat a variety of colors and types of fruit to get a full range of vitamins and   minerals. Remember to wash your fruits well before eating. Meats and Other Protein Sources Lean meats, including chicken, turkey, fish, and lean cuts of beef, veal, or pork. Make sure that all meats are cooked to "well done." Tofu. Tempeh. Beans. Eggs. Peanut butter and other nut butters. Seafood, such as shrimp, crab, and lobster. If you choose fish, select types that are higher in omega-3 fatty acids, including salmon, herring, mussels, trout, sardines, and pollock. Make sure that all meats are cooked to food-safe temperatures. Dairy Pasteurized milk and milk alternatives. Pasteurized yogurt and pasteurized cheese. Cottage cheese. Sour cream. Beverages Water. Juices that contain 100% fruit juice or vegetable juice. Caffeine-free teas and decaffeinated coffee. Drinks that contain caffeine are okay to drink, but it is better to avoid caffeine. Keep your total caffeine intake to less than 200 mg each day (12 oz of coffee, tea, or soda) or as directed by your health care provider. Condiments Any pasteurized condiments. Sweets and Desserts Any sweets and desserts. Fats and Oils Any fats and oils. The items listed above may not be a complete list of recommended foods or beverages. Contact your dietitian for more options. What foods are not  recommended? Vegetables Unpasteurized (raw) vegetable juices. Fruits Unpasteurized (raw) fruit juices. Meats and Other Protein Sources Cured meats that have nitrates, such as bacon, salami, and hotdogs. Luncheon meats, bologna, or other deli meats (unless they are reheated until they are steaming hot). Refrigerated pate, meat spreads from a meat counter, smoked seafood that is found in the refrigerated section of a store. Raw fish, such as sushi or sashimi. High mercury content fish, such as tilefish, shark, swordfish, and king mackerel. Raw meats, such as tuna or beef tartare. Undercooked meats and poultry. Make sure that all meats are cooked to food-safe temperatures. Dairy Unpasteurized (raw) milk and any foods that have raw milk in them. Soft cheeses, such as feta, queso blanco, queso fresco, Brie, Camembert cheeses, blue-veined cheeses, and Panela cheese (unless it is made with pasteurized milk, which must be stated on the label). Beverages Alcohol. Sugar-sweetened beverages, such as sodas, teas, or energy drinks. Condiments Homemade fermented foods and drinks, such as pickles, sauerkraut, or kombucha drinks. (Store-bought pasteurized versions of these are okay.) Other Salads that are made in the store, such as ham salad, chicken salad, egg salad, tuna salad, and seafood salad. The items listed above may not be a complete list of foods and beverages to avoid. Contact your dietitian for more information. This information is not intended to replace advice given to you by your health care provider. Make sure you discuss any questions you have with your health care provider. Document Released: 06/03/2014 Document Revised: 01/25/2016 Document Reviewed: 02/01/2014 [employer] Interactive Patient Education  2018 [employer]  

## 2017-10-03 LAB — PROTEIN / CREATININE RATIO, URINE
CREATININE, UR: 155.3 mg/dL
PROTEIN UR: 13.4 mg/dL
Protein/Creat Ratio: 86 mg/g creat (ref 0–200)

## 2017-10-05 LAB — CHLAMYDIA/GONOCOCCUS/TRICHOMONAS, NAA
Chlamydia by NAA: NEGATIVE
Gonococcus by NAA: NEGATIVE
Trich vag by NAA: NEGATIVE

## 2017-10-07 LAB — URINE DRUG PANEL 7
Amphetamines, Urine: NEGATIVE ng/mL
Barbiturate Quant, Ur: NEGATIVE ng/mL
Benzodiazepine Quant, Ur: NEGATIVE ng/mL
Cannabinoid Quant, Ur: POSITIVE — AB
Cocaine (Metab.): NEGATIVE ng/mL
OPIATE QUANT UR: NEGATIVE ng/mL
PCP Quant, Ur: NEGATIVE ng/mL

## 2017-10-07 LAB — URINE CULTURE: Organism ID, Bacteria: NO GROWTH

## 2017-10-30 ENCOUNTER — Encounter: Payer: Medicaid Other | Admitting: Maternal Newborn

## 2017-10-30 ENCOUNTER — Other Ambulatory Visit: Payer: Medicaid Other

## 2017-11-03 ENCOUNTER — Encounter: Payer: Medicaid Other | Admitting: Maternal Newborn

## 2017-11-03 ENCOUNTER — Other Ambulatory Visit: Payer: Medicaid Other

## 2017-11-04 IMAGING — US US MFM UA CORD DOPPLER
1 series · 13 of 28 positions shown · non-contrast
Comparison: none

[Series 1: us mfm ua cord doppler · 13 of 46 slices shown]
[im 2/46]
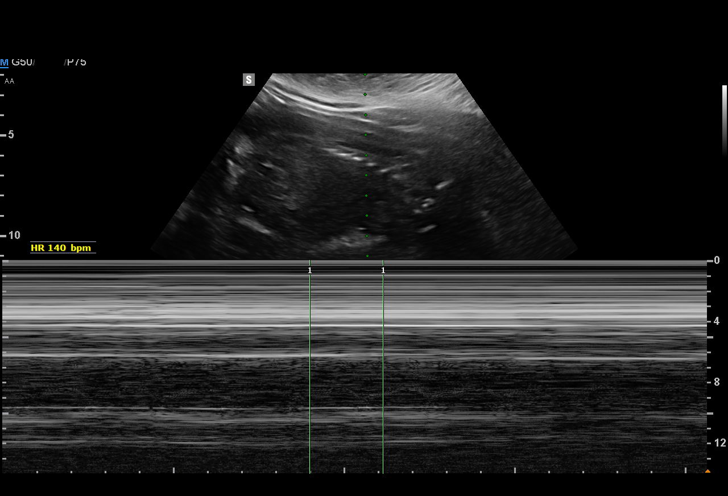
[im 6/46]
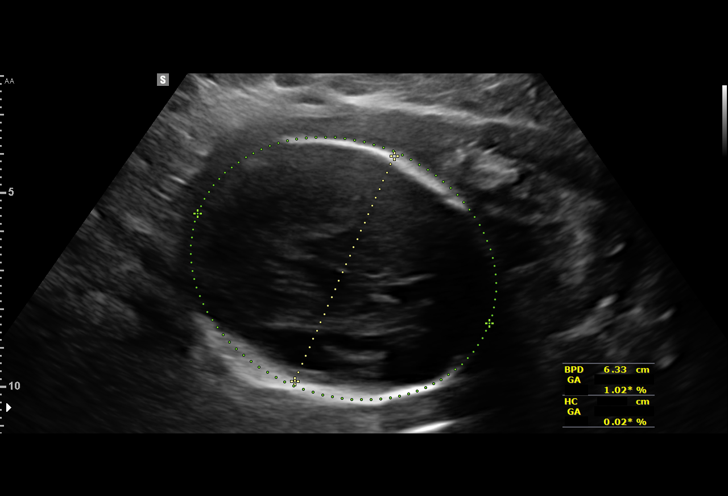
[im 9/46]
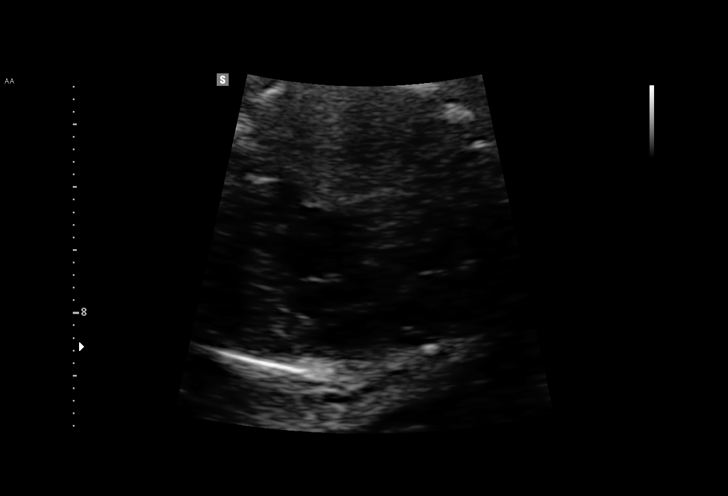
[im 12/46]
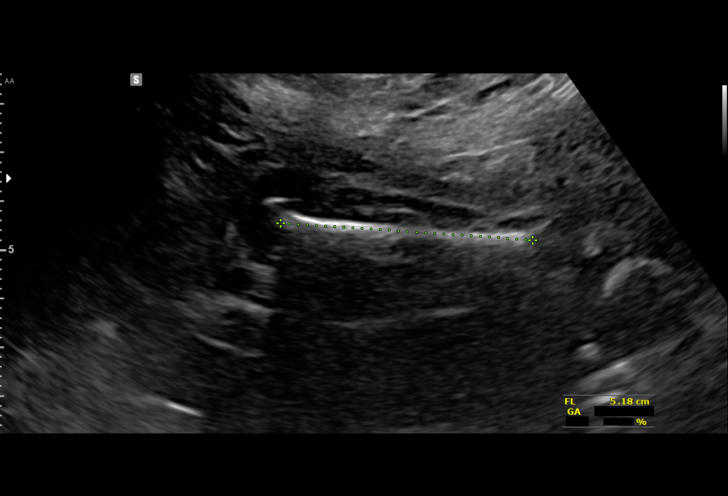
[im 16/46]
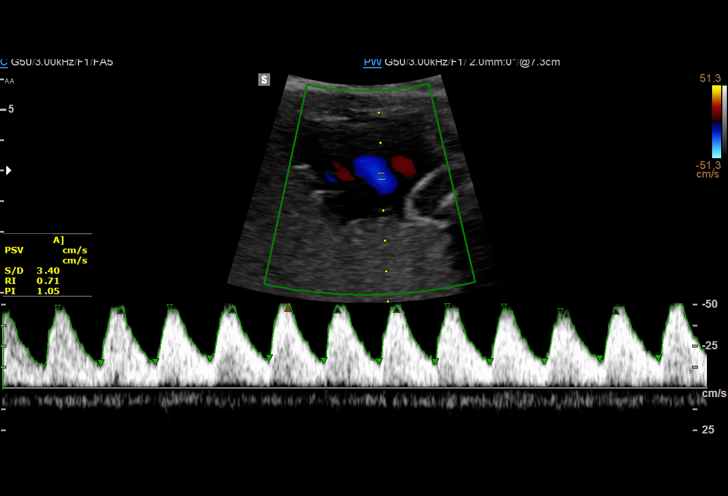
[im 19/46]
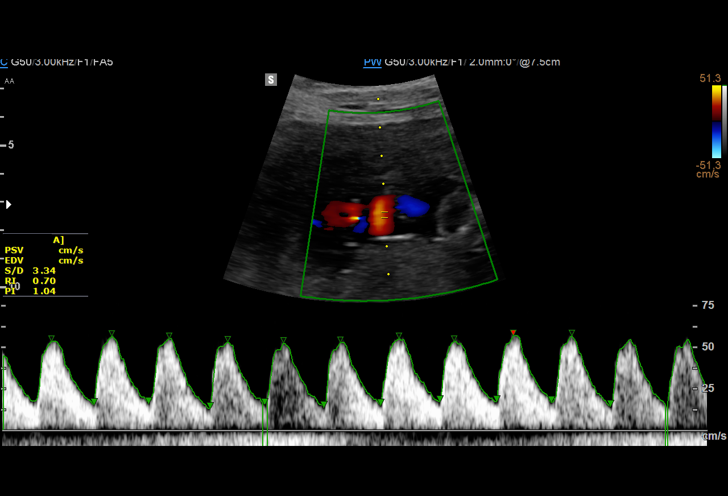
[im 24/46]
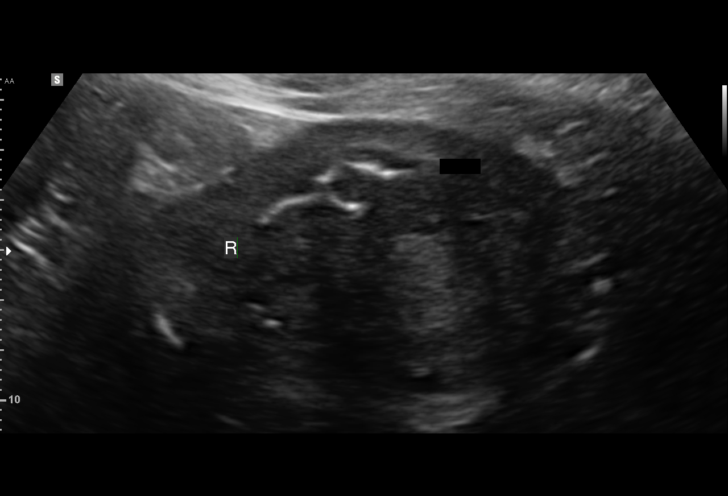
[im 27/46]
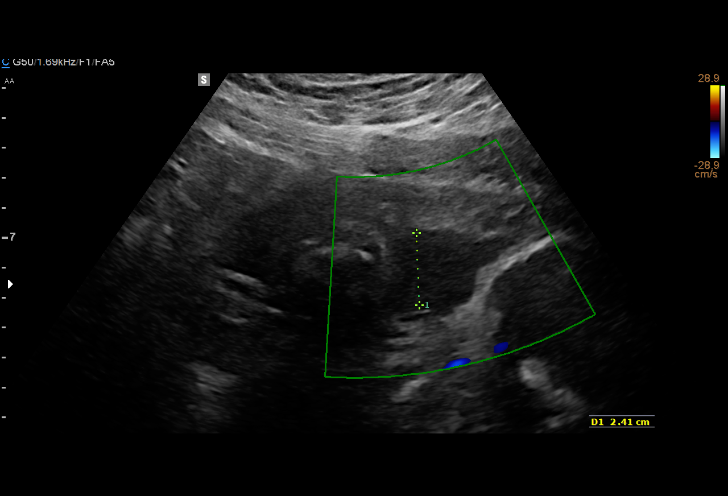
[im 31/46]
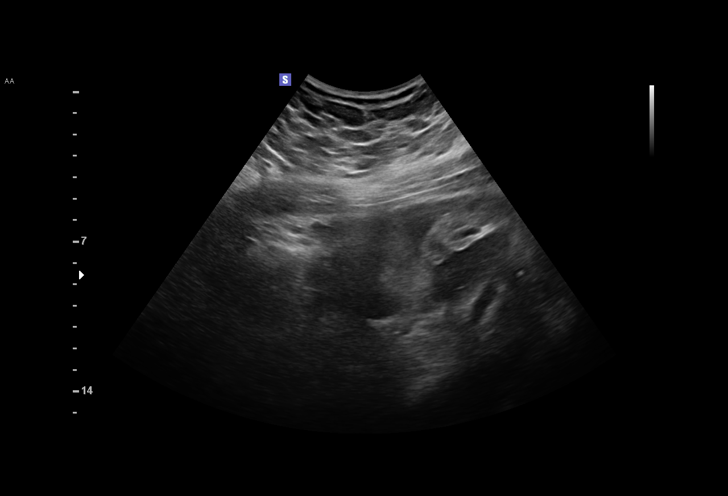
[im 34/46]
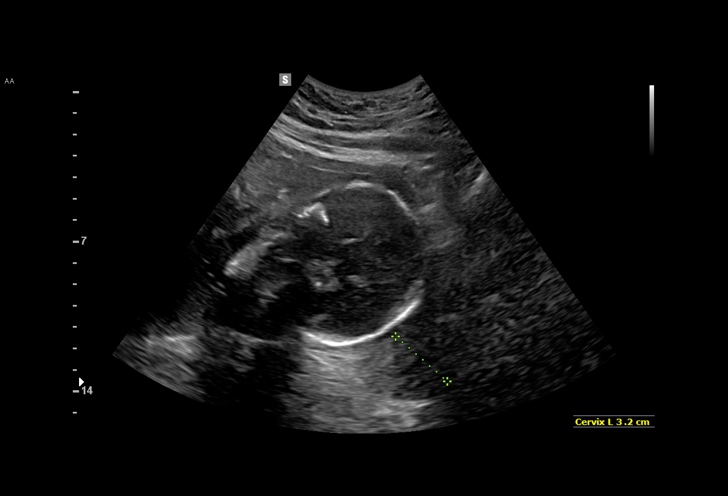
[im 37/46]
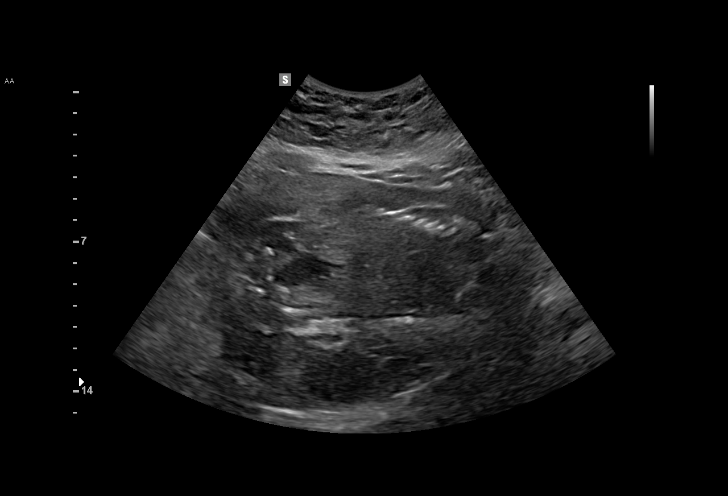
[im 41/46]
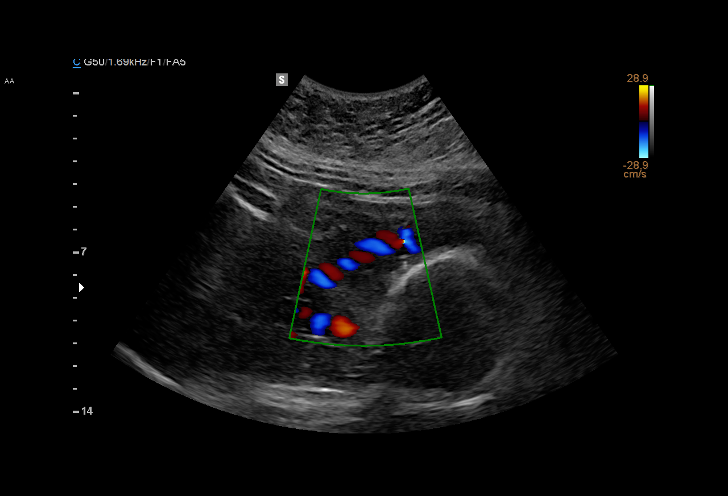
[im 44/46]
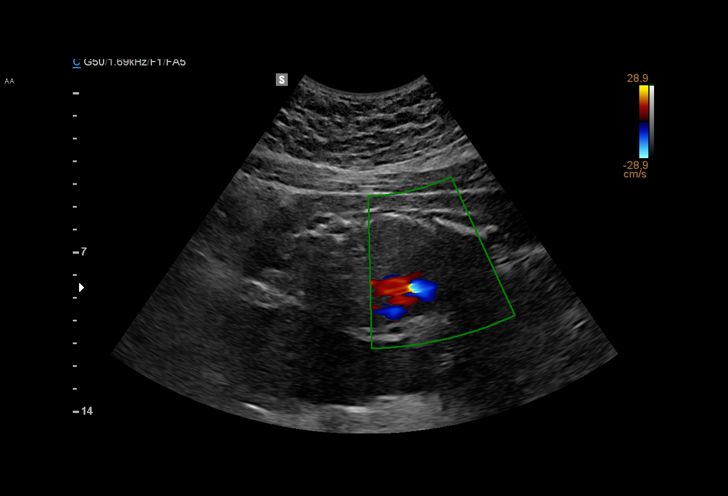

[13 of 28 positions shown; findings below may reference images not displayed]

am)

Hospital Clinic-
Faculty Physician
OB/Gyn Clinic

1  JUNGEL NEUPANE            [id number]      [id number]     [id number]
2  JUNGEL NEUPANE            [id number]      [id number]     [id number]
Indications

27 weeks gestation of pregnancy
Follow-up incomplete fetal anatomic            Z36
evaluation
Obesity complicating pregnancy, second
trimester
Tobacco use complicating pregnancy,
second trimester
Asthma                                         D44.E4 [id number]
OB History

Blood Type:            Height:  5'1"   Weight (lb):  233       BMI:
Gravidity:    3         Term:   1        Prem:   0        SAB:   1
TOP:          0       Ectopic:  0        Living: 1
Fetal Evaluation

Num Of Fetuses:     1
Fetal Heart         140
Rate(bpm):
Cardiac Activity:   Observed
Presentation:       Cephalic
Placenta:           Posterior, above cervical os
Amniotic Fluid
AFI FV:      Subjectively decreased

AFI Sum(cm)     %Tile       Largest Pocket(cm)
4.15            < 3

RUQ(cm)       RLQ(cm)       LUQ(cm)        LLQ(cm)
0             1
Biometry

BPD:      62.5  mm     G. Age:  25w 2d          1  %    CI:        73.41   %    70 - 86
FL/HC:      22.3   %    18.8 -
HC:      231.8  mm     G. Age:  25w 1d        < 3  %    HC/AC:      1.16        1.05 -
AC:       200   mm     G. Age:  24w 4d        < 3  %    FL/BPD:     82.7   %    71 - 87
FL:       51.7  mm     G. Age:  27w 4d         28  %    FL/AC:      25.9   %    20 - 24

Est. FW:     856  gm    1 lb 14 oz      17  %
Gestational Age

LMP:           24w 3d        Date:  09/02/15                 EDD:   06/08/16
U/S Today:     25w 5d                                        EDD:   05/30/16
Best:          27w 6d     Det. By:  Early Ultrasound         EDD:   05/15/16
(10/28/15)
Anatomy

Cranium:               Appears normal         Aortic Arch:            Appears normal
Cavum:                 Appears normal         Ductal Arch:            Appears normal
Ventricles:            Appears normal         Diaphragm:              Appears normal
Choroid Plexus:        Appears normal         Stomach:                Appears normal, left
sided
Cerebellum:            Appears normal         Abdomen:                Previously seen
Posterior Fossa:       Appears normal         Abdominal Wall:         Previously seen
Nuchal Fold:           Not applicable (>20    Cord Vessels:           Previously seen
wks GA)
Face:                  Orbits nl; profile not Kidneys:                Appear normal
well visualized
Lips:                  Not well visualized    Bladder:                Appears normal
Thoracic:              Appears normal         Spine:                  Previously seen
Heart:                 Not well visualized    Upper Extremities:      Previously seen
RVOT:                  Not well visualized    Lower Extremities:      Previously seen
LVOT:                  Not well visualized

Other:  Parents do not wish to know sex of fetus. Fetus appears to be a
female. Heels visualized. Technically difficult due to maternal habitus
and fetal position.
Doppler - Fetal Vessels

Umbilical Artery
S/D     %tile     RI              PI              PSV
(cm/s)
3.28       60

Cervix Uterus Adnexa
Cervix
Length:            3.1  cm.
Normal appearance by transabdominal scan.
Impression

Singleton intrauterine pregnancy at 27 weeks 6 days
gestation with fetal cardiac activity
Cephalic presentation
Posterior placenta without evidence of previa
Normal appearing estimated fetal weight although AC lag
Oligohydramnios (she notes drinking 2 glasses of water a
day)
Normal appearing cervical length
Normal appearing UA Dopplers
Recommendations

Repeat ultrasound in one week for fluid with increased
hydration and 3 weeks for growth and UA Doppler
Attending Physician, ODED

## 2017-11-11 ENCOUNTER — Encounter: Payer: Self-pay | Admitting: Advanced Practice Midwife

## 2017-11-11 ENCOUNTER — Other Ambulatory Visit: Payer: Medicaid Other

## 2017-11-11 ENCOUNTER — Ambulatory Visit (INDEPENDENT_AMBULATORY_CARE_PROVIDER_SITE_OTHER): Payer: Medicaid Other | Admitting: Advanced Practice Midwife

## 2017-11-11 VITALS — BP 118/78 | Wt 284.0 lb

## 2017-11-11 DIAGNOSIS — O99210 Obesity complicating pregnancy, unspecified trimester: Secondary | ICD-10-CM

## 2017-11-11 DIAGNOSIS — Z8759 Personal history of other complications of pregnancy, childbirth and the puerperium: Secondary | ICD-10-CM

## 2017-11-11 DIAGNOSIS — Z113 Encounter for screening for infections with a predominantly sexual mode of transmission: Secondary | ICD-10-CM

## 2017-11-11 DIAGNOSIS — O0990 Supervision of high risk pregnancy, unspecified, unspecified trimester: Secondary | ICD-10-CM

## 2017-11-11 DIAGNOSIS — Z3A15 15 weeks gestation of pregnancy: Secondary | ICD-10-CM

## 2017-11-11 DIAGNOSIS — Z131 Encounter for screening for diabetes mellitus: Secondary | ICD-10-CM

## 2017-11-11 HISTORY — DX: Obesity complicating pregnancy, unspecified trimester: O99.210

## 2017-11-11 NOTE — Progress Notes (Signed)
Early 1 hr gtt & NOB labs today. No complaints.

## 2017-11-11 NOTE — Patient Instructions (Signed)

## 2017-11-11 NOTE — Progress Notes (Signed)
  Routine Prenatal Care Visit  Subjective  Robin [name] is a 27 y.o. [id number] at 4468w1d being seen today for ongoing prenatal care.  She is currently monitored for the following issues for this high-risk pregnancy and has Supervision of high risk pregnancy, antepartum and Obesity affecting pregnancy, antepartum on their problem list.  ----------------------------------------------------------------------------------- Patient reports some back pain especially when she gets up in the morning. She has just gotten a new mattress.    . Vag. Bleeding: None.  Movement: Absent. Denies leaking of fluid.  ----------------------------------------------------------------------------------- The following portions of the patient's history were reviewed and updated as appropriate: allergies, current medications, past family history, past medical history, past social history, past surgical history and problem list. Problem list updated.   Objective  Blood pressure 118/78, weight 284 lb (128.8 kg), last menstrual period 07/28/2017, unknown if currently breastfeeding. Pregravid weight 230 lb (104.3 kg) Total Weight Gain 54 lb (24.5 kg) Urinalysis: Urine Protein: Negative Urine Glucose: Negative  Fetal Status: Fetal Heart Rate (bpm): 162   Movement: Absent     General:  Alert, oriented and cooperative. Patient is in no acute distress.  Skin: Skin is warm and dry. No rash noted.   Cardiovascular: Normal heart rate noted  Respiratory: Normal respiratory effort, no problems with respiration noted  Abdomen: Soft, gravid, appropriate for gestational age. Pain/Pressure: Absent     Pelvic:  Cervical exam deferred        Extremities: Normal range of motion.     Mental Status: Normal mood and affect. Normal behavior. Normal judgment and thought content.   Assessment   27 y.o. [id number] at 6668w1d by  05/04/2018, by Last Menstrual Period presenting for routine prenatal visit  Plan   pregnancy Problems (from 10/02/17  to present)    Problem Noted Resolved   Obesity affecting pregnancy, antepartum 11/11/2017 by Tresea MallGledhill, Lynett Brasil, CNM No       Preterm labor symptoms and general obstetric precautions including but not limited to vaginal bleeding, contractions, leaking of fluid and fetal movement were reviewed in detail with the patient. Please refer to After Visit Summary for other counseling recommendations.  Recommend stretching exercises for back pain  Return in about 4 weeks (around 12/09/2017) for anatomy scan and rob.  Tresea MallJane Josephine [name], CNM 11/11/2017 2:38 PM

## 2017-11-13 LAB — RPR+RH+ABO+RUB AB+AB SCR+CB...
ANTIBODY SCREEN: NEGATIVE
HEMATOCRIT: 33.9 % — AB (ref 34.0–46.6)
HEMOGLOBIN: 11 g/dL — AB (ref 11.1–15.9)
HIV SCREEN 4TH GENERATION: NONREACTIVE
Hepatitis B Surface Ag: NEGATIVE
MCH: 26.6 pg (ref 26.6–33.0)
MCHC: 32.4 g/dL (ref 31.5–35.7)
MCV: 82 fL (ref 79–97)
Platelets: 294 10*3/uL (ref 150–379)
RBC: 4.14 x10E6/uL (ref 3.77–5.28)
RDW: 14.2 % (ref 12.3–15.4)
RH TYPE: POSITIVE
RPR: NONREACTIVE
Rubella Antibodies, IGG: 4.48 index (ref >0.99)
Varicella zoster IgG: 521 index (ref >165)
WBC: 9.9 10*3/uL (ref 3.4–10.8)

## 2017-11-13 LAB — COMPREHENSIVE METABOLIC PANEL
ALK PHOS: 67 IU/L (ref 39–117)
ALT: 12 IU/L (ref 0–32)
AST: 9 IU/L (ref 0–40)
Albumin/Globulin Ratio: 1.3 (ref 1.2–2.2)
Albumin: 3.5 g/dL (ref 3.5–5.5)
BUN/Creatinine Ratio: 14 (ref 9–23)
BUN: 9 mg/dL (ref 6–20)
CHLORIDE: 107 mmol/L — AB (ref 96–106)
CO2: 19 mmol/L — ABNORMAL LOW (ref 20–29)
Calcium: 9.3 mg/dL (ref 8.7–10.2)
Creatinine, Ser: 0.66 mg/dL (ref 0.57–1.00)
GFR calc Af Amer: 140 mL/min/{1.73_m2} (ref >59)
GFR calc non Af Amer: 121 mL/min/{1.73_m2} (ref >59)
GLUCOSE: 76 mg/dL (ref 65–99)
Globulin, Total: 2.6 g/dL (ref 1.5–4.5)
POTASSIUM: 4 mmol/L (ref 3.5–5.2)
Sodium: 139 mmol/L (ref 134–144)
Total Protein: 6.1 g/dL (ref 6.0–8.5)

## 2017-11-13 LAB — CBC WITH DIFFERENTIAL/PLATELET
BASOS ABS: 0 10*3/uL (ref 0.0–0.2)
Basos: 0 %
EOS (ABSOLUTE): 0.1 10*3/uL (ref 0.0–0.4)
Eos: 1 %
Immature Grans (Abs): 0 10*3/uL (ref 0.0–0.1)
Immature Granulocytes: 0 %
LYMPHS ABS: 2.4 10*3/uL (ref 0.7–3.1)
LYMPHS: 24 %
MONOS ABS: 0.6 10*3/uL (ref 0.1–0.9)
Monocytes: 7 %
NEUTROS ABS: 6.7 10*3/uL (ref 1.4–7.0)
NEUTROS PCT: 68 %

## 2017-11-13 LAB — HEMOGLOBINOPATHY EVALUATION
HGB A: 98 % (ref 96.4–98.8)
HGB C: 0 %
HGB S: 0 %
HGB VARIANT: 0 %
Hemoglobin A2 Quantitation: 2 % (ref 1.8–3.2)
Hemoglobin F Quantitation: 0 % (ref 0.0–2.0)

## 2017-11-13 LAB — GLUCOSE, 1 HOUR GESTATIONAL: GESTATIONAL DIABETES SCREEN: 79 mg/dL (ref 65–139)

## 2017-12-08 ENCOUNTER — Encounter: Payer: Self-pay | Admitting: *Deleted

## 2017-12-09 ENCOUNTER — Ambulatory Visit (INDEPENDENT_AMBULATORY_CARE_PROVIDER_SITE_OTHER): Payer: Medicaid Other | Admitting: Maternal Newborn

## 2017-12-09 ENCOUNTER — Ambulatory Visit (INDEPENDENT_AMBULATORY_CARE_PROVIDER_SITE_OTHER): Payer: Medicaid Other

## 2017-12-09 ENCOUNTER — Encounter: Payer: Self-pay | Admitting: Maternal Newborn

## 2017-12-09 VITALS — BP 110/70 | Wt 279.0 lb

## 2017-12-09 DIAGNOSIS — Z3A19 19 weeks gestation of pregnancy: Secondary | ICD-10-CM

## 2017-12-09 DIAGNOSIS — Z8759 Personal history of other complications of pregnancy, childbirth and the puerperium: Secondary | ICD-10-CM

## 2017-12-09 DIAGNOSIS — Z3689 Encounter for other specified antenatal screening: Secondary | ICD-10-CM

## 2017-12-09 DIAGNOSIS — Z6841 Body Mass Index (BMI) 40.0 and over, adult: Secondary | ICD-10-CM

## 2017-12-09 DIAGNOSIS — O0990 Supervision of high risk pregnancy, unspecified, unspecified trimester: Secondary | ICD-10-CM | POA: Diagnosis not present

## 2017-12-09 DIAGNOSIS — O99210 Obesity complicating pregnancy, unspecified trimester: Secondary | ICD-10-CM

## 2017-12-09 NOTE — Progress Notes (Signed)
    Routine Prenatal Care Visit  Subjective  Robin [name] is a 27 y.o. [id number] at 2731w1d being seen today for ongoing prenatal care.  She is currently monitored for the following issues for this high-risk pregnancy and has Supervision of high risk pregnancy, antepartum; Obesity affecting pregnancy, antepartum; History of gestational hypertension; and BMI 50.0-59.9, adult (HCC) on their problem list.  ----------------------------------------------------------------------------------- Patient reports no complaints.   Vag. Bleeding: None.  Movement: Present. No leaking of fluid.  ----------------------------------------------------------------------------------- The following portions of the patient's history were reviewed and updated as appropriate: allergies, current medications, past family history, past medical history, past social history, past surgical history and problem list. Problem list updated.   Objective  Last menstrual period 07/28/2017, unknown if currently breastfeeding. Pregravid weight 230 lb (104.3 kg) Total Weight Gain 49 lb (22.2 kg) Urinalysis: Urine Protein: Negative Urine Glucose: Negative  Fetal Status: Fetal Heart Rate (bpm): 145   Movement: Present     General:  Alert, oriented and cooperative. Patient is in no acute distress.  Skin: Skin is warm and dry. No rash noted.   Cardiovascular: Normal heart rate noted  Respiratory: Normal respiratory effort, no problems with respiration noted  Abdomen: Soft, gravid, appropriate for gestational age. Pain/Pressure: Absent     Pelvic:  Cervical exam deferred        Extremities: Normal range of motion.     Mental Status: Normal mood and affect. Normal behavior. Normal judgment and thought content.     Assessment   26 y.o. A5W0981G4P1112 at 4131w1d, EDD 05/04/2018 by Last Menstrual Period presenting for routine prenatal visit.  Plan   pregnancy Problems (from 10/02/17 to present)    Problem Noted Resolved   Obesity affecting  pregnancy, antepartum 11/11/2017 by Tresea MallGledhill, Jane, CNM No   Supervision of high risk pregnancy, antepartum 12/05/2015 by Hurshel PartyLeftwich-Kirby, Lisa A, CNM No   Overview Addendum 12/09/2017 11:15 AM by Oswaldo ConroySchmid, Jacelyn Y, CNM    Clinic [facility] Prenatal Labs  Dating  Blood type: A/Positive/-- (03/12 1518)   Genetic Screen 1 Screen:    AFP:     Quad:     NIPS: Antibody:Negative (03/12 1518)  Anatomic US  Rubella: 4.48 (03/12 1518) Varicella: Immune  GTT Early:               Third trimester:  RPR: Non Reactive (03/12 1518)   Rhogam  HBsAg: Negative (03/12 1518)   TDaP vaccine                       Flu Shot: HIV: Non Reactive (03/12 1518)   Baby Food                                GBS:   Contraception  Pap:  CBB     CS/VBAC    Support Person             Anatomy scan incomplete today, follow-up in 4 weeks. Anesthesiology consult done today as required for BMI >50. Rx sent for low dose aspirin to prevent pre-eclampsia and advised to begin taking.  Gestational age appropriate obstetric precautions were reviewed.  Please refer to After Visit Summary for other counseling recommendations.   Return in about 1 month (around 01/06/2018) for ROB and follow up anatomy scan.  Marcelyn BruinsJacelyn Schmid, CNM 12/10/2017  11:07 AM

## 2017-12-09 NOTE — Progress Notes (Signed)
No concerns.rj 

## 2017-12-09 NOTE — Patient Instructions (Signed)

## 2017-12-10 DIAGNOSIS — Z8759 Personal history of other complications of pregnancy, childbirth and the puerperium: Secondary | ICD-10-CM | POA: Insufficient documentation

## 2017-12-10 DIAGNOSIS — Z6841 Body Mass Index (BMI) 40.0 and over, adult: Secondary | ICD-10-CM | POA: Insufficient documentation

## 2017-12-10 HISTORY — DX: Personal history of other complications of pregnancy, childbirth and the puerperium: Z87.59

## 2017-12-10 MED ORDER — ASPIRIN EC 81 MG PO TBEC: 81.0000 mg | DELAYED_RELEASE_TABLET | Freq: Every day | ORAL | 2 refills | Status: DC

## 2018-01-06 ENCOUNTER — Other Ambulatory Visit: Payer: Medicaid Other

## 2018-01-06 ENCOUNTER — Encounter: Payer: Medicaid Other | Admitting: Certified Nurse Midwife

## 2018-01-07 ENCOUNTER — Other Ambulatory Visit: Payer: Self-pay | Admitting: Maternal Newborn

## 2018-01-07 DIAGNOSIS — Z3689 Encounter for other specified antenatal screening: Secondary | ICD-10-CM

## 2018-01-09 ENCOUNTER — Ambulatory Visit (INDEPENDENT_AMBULATORY_CARE_PROVIDER_SITE_OTHER): Payer: Medicaid Other | Admitting: Certified Nurse Midwife

## 2018-01-09 ENCOUNTER — Encounter: Payer: Self-pay | Admitting: Certified Nurse Midwife

## 2018-01-09 ENCOUNTER — Ambulatory Visit (INDEPENDENT_AMBULATORY_CARE_PROVIDER_SITE_OTHER): Payer: Self-pay

## 2018-01-09 VITALS — BP 108/76 | Wt 290.0 lb

## 2018-01-09 DIAGNOSIS — Z6841 Body Mass Index (BMI) 40.0 and over, adult: Secondary | ICD-10-CM

## 2018-01-09 DIAGNOSIS — O0990 Supervision of high risk pregnancy, unspecified, unspecified trimester: Secondary | ICD-10-CM

## 2018-01-09 DIAGNOSIS — O99210 Obesity complicating pregnancy, unspecified trimester: Secondary | ICD-10-CM

## 2018-01-09 DIAGNOSIS — O09299 Supervision of pregnancy with other poor reproductive or obstetric history, unspecified trimester: Secondary | ICD-10-CM

## 2018-01-09 DIAGNOSIS — Z3A23 23 weeks gestation of pregnancy: Secondary | ICD-10-CM

## 2018-01-09 DIAGNOSIS — Z3689 Encounter for other specified antenatal screening: Secondary | ICD-10-CM

## 2018-01-09 DIAGNOSIS — O09292 Supervision of pregnancy with other poor reproductive or obstetric history, second trimester: Secondary | ICD-10-CM

## 2018-01-09 DIAGNOSIS — O99212 Obesity complicating pregnancy, second trimester: Secondary | ICD-10-CM

## 2018-01-09 DIAGNOSIS — Z3A24 24 weeks gestation of pregnancy: Secondary | ICD-10-CM

## 2018-01-09 NOTE — Progress Notes (Signed)
Pt states she fell on right side going up stairs 2 days ago and has been having pain on that side since. Follow up anatomy scan today.

## 2018-01-11 ENCOUNTER — Encounter: Payer: Self-pay | Admitting: Certified Nurse Midwife

## 2018-01-11 DIAGNOSIS — O09299 Supervision of pregnancy with other poor reproductive or obstetric history, unspecified trimester: Secondary | ICD-10-CM | POA: Insufficient documentation

## 2018-01-11 NOTE — Progress Notes (Addendum)
HROB at 23wk4days/ Follow up anatomy scan  +FM, no bleeding. Normal appearing placenta on ultrasound today following fall on side 2 days ago.  Anatomy scan still now complete for RVOT, diaphragm, N/L, and spine. There is some pelviectasis (left pelviectasis measuring 7.1 mm) Current BMI 54.79-advised will need to transfer prenatal care per anesthesia protocol  Prefers transfer to [facility]. Will need transportation to appointments. Will contact Jola Babinski to arrange. Will need next appointment in 4 weeks for growth scan , 28 week labs and ROB  Farrel Conners, CNM

## 2018-03-25 DIAGNOSIS — I10 Essential (primary) hypertension: Secondary | ICD-10-CM | POA: Insufficient documentation

## 2018-04-29 DIAGNOSIS — F329 Major depressive disorder, single episode, unspecified: Secondary | ICD-10-CM | POA: Insufficient documentation

## 2018-04-29 DIAGNOSIS — F32A Depression, unspecified: Secondary | ICD-10-CM | POA: Insufficient documentation

## 2018-04-29 DIAGNOSIS — Z87898 Personal history of other specified conditions: Secondary | ICD-10-CM | POA: Insufficient documentation

## 2018-05-05 ENCOUNTER — Encounter: Payer: Self-pay | Admitting: Certified Nurse Midwife

## 2019-07-21 ENCOUNTER — Other Ambulatory Visit: Payer: Self-pay

## 2019-07-21 DIAGNOSIS — Z5321 Procedure and treatment not carried out due to patient leaving prior to being seen by health care provider: Secondary | ICD-10-CM | POA: Insufficient documentation

## 2019-07-21 NOTE — ED Triage Notes (Signed)
Pt c/o lower back pain after bending down to get a pot. Pt thinks she may have pulled something. PT ambulatory to triage.

## 2019-07-22 ENCOUNTER — Emergency Department
Admission: EM | Admit: 2019-07-22 | Discharge: 2019-07-22 | Disposition: A | Discharge status: Home / Self Care | Payer: Self-pay | Attending: Emergency Medicine | Admitting: Emergency Medicine

## 2019-07-22 NOTE — ED Notes (Signed)
No answer when called several times from lobby 

## 2019-09-03 HISTORY — PX: WISDOM TOOTH EXTRACTION: SHX21

## 2019-09-03 NOTE — L&D Delivery Note (Signed)
Delivery Note Called to bedside by Dr. Macon Large for fetal bradycardia, with prolonged deceleration in the 80s. Patient was instructed to initiate pushing at this time and cervical lip was reduced. FHT recovered. At 7:18 PM a viable female was delivered via Vaginal, Spontaneous (Presentation: Occiput Anterior).  APGAR: 4, 9; weight 7 lb 4.1 oz (3291 g).   Placenta status: intact, spontaneous .  Cord: 3 vessels with the following complications: None.  Cord pH: pending.  Anesthesia: Epidural Episiotomy:  none Lacerations:  none Est. Blood Loss (mL):  Mom to postpartum.  Baby to Couplet care / Skin to Skin.  Robin [name] 07/27/2020, 7:42 PM

## 2020-01-25 ENCOUNTER — Ambulatory Visit (LOCAL_COMMUNITY_HEALTH_CENTER): Payer: Self-pay

## 2020-01-25 ENCOUNTER — Other Ambulatory Visit: Payer: Self-pay

## 2020-01-25 VITALS — BP 114/79 | Ht 61.0 in | Wt 327.0 lb

## 2020-01-25 DIAGNOSIS — Z3201 Encounter for pregnancy test, result positive: Secondary | ICD-10-CM

## 2020-01-25 LAB — PREGNANCY, URINE: Preg Test, Ur: POSITIVE — AB

## 2020-01-25 MED ORDER — PRENATAL VITAMIN 27-0.8 MG PO TABS: 1.0000 | ORAL_TABLET | Freq: Every day | ORAL | 0 refills | Status: DC

## 2020-01-25 NOTE — Progress Notes (Signed)
Client counseled that may begin [facility] at [facility], but if considered a high risk pregnancy will be transferred to appropriate provider. Client verbalized understanding. Jossie Ng, RN

## 2020-02-15 ENCOUNTER — Other Ambulatory Visit: Payer: Self-pay

## 2020-03-16 ENCOUNTER — Ambulatory Visit (INDEPENDENT_AMBULATORY_CARE_PROVIDER_SITE_OTHER): Payer: Medicaid Other | Admitting: Obstetrics

## 2020-03-16 ENCOUNTER — Other Ambulatory Visit: Payer: Self-pay

## 2020-03-16 ENCOUNTER — Encounter: Payer: Self-pay | Admitting: Obstetrics

## 2020-03-16 VITALS — BP 126/88 | Wt 329.0 lb

## 2020-03-16 DIAGNOSIS — K0889 Other specified disorders of teeth and supporting structures: Secondary | ICD-10-CM

## 2020-03-16 DIAGNOSIS — O0990 Supervision of high risk pregnancy, unspecified, unspecified trimester: Secondary | ICD-10-CM | POA: Insufficient documentation

## 2020-03-16 DIAGNOSIS — Z3A15 15 weeks gestation of pregnancy: Secondary | ICD-10-CM

## 2020-03-16 DIAGNOSIS — O99210 Obesity complicating pregnancy, unspecified trimester: Secondary | ICD-10-CM

## 2020-03-16 HISTORY — DX: Supervision of high risk pregnancy, unspecified, unspecified trimester: O09.90

## 2020-03-16 MED ORDER — OXYCODONE HCL 5 MG PO TABS: 5.0000 mg | ORAL_TABLET | ORAL | 0 refills | Status: AC | PRN

## 2020-03-16 NOTE — Progress Notes (Signed)
New Obstetric Patient H&P    Chief Complaint: "Desires prenatal care"   History of Present Illness: Patient is a 29 y.o. N8G9562 Not Hispanic or Latino female, LMP uncertain, 11/2019? presents with amenorrhea and positive home pregnancy test. Based on her  LMP, her EDD is Estimated Date of 4-5 days duration, irregular, and occur approximately every : not applicable days. Her last pap smear was approximately 3 years ago and was results not available.    She had a urine pregnancy test which was positive a few month(s)  ago. Her last menstrual period was normal and lasted for  a few day(s). Since her LMP she claims she has experienced fatigue, occasional nausea and weight gain.Marland Kitchen  She was not contracepting, and has a 30 year old at home.she delivered at [facility], and was to f/u with a Family Practice group, but did not. She has experienced homelessness and has had trouble getting medical services. She denies vaginal bleeding. Her past medical history is notable for HTN, Morbid obesity, and insufficient prenatal care in the past.. Her prior pregnancies are notable for chronic HTN, drug use, and difficulty accessing care.  Since her LMP, she admits to the use of tobacco products  no She claims she has gained   10 pounds since the start of her pregnancy.  There are cats in the home in the home  yes If yes Indoor She admits close contact with children on a regular basis  yes  She has had chicken pox in the past unknown She has had Tuberculosis exposures, symptoms, or previously tested positive for TB   no Current or past history of domestic violence. no  Genetic Screening/Teratology Counseling: (Includes patient, baby's father, or anyone in either family with:)   1. Patient's age >/= 52 [facility]  no 2. Thalassemia ([place], [place], Mediterranean, or Asian background): MCV<80  no 3. Neural tube defect (meningomyelocele, spina bifida, anencephaly)  no 4. Congenital heart defect  no  5. Down syndrome   no 6. Tay-Sachs (Jewish, [place]  no 7. Canavan's Disease  no 8. Sickle cell disease or trait (African)  no  9. Hemophilia or other blood disorders  no  10. Muscular dystrophy  no  11. Cystic fibrosis  no  12. Huntington's Chorea  no  13. Mental retardation/autism  no 14. Other inherited genetic or chromosomal disorder  no 15. Maternal metabolic disorder (DM, PKU, etc)  no 16. Patient or FOB with a child with a birth defect not listed above no  16a. Patient or FOB with a birth defect themselves no 17. Recurrent pregnancy loss, or stillbirth  no  18. Any medications since LMP other than prenatal vitamins (include vitamins, supplements, OTC meds, drugs, alcohol)  Yes- Marijuana 19. Any other genetic/environmental exposure to discuss  no  Infection History:   1. Lives with someone with TB or TB exposed  no  2. Patient or partner has history of genital herpes  no 3. Rash or viral illness since LMP  no 4. History of STI (GC, CT, HPV, syphilis, HIV)  unknown 5. History of recent travel :  no  Other pertinent information:  yes     Review of Systems:10 point review of systems negative unless otherwise noted in HPI  Past Medical History:  Past Medical History:  Diagnosis Date  . Anemia    4th pregnancy  . Asthma    No Inhaler  . Depression    Hx of Depression, refused medication.  Marland Kitchen  History of urinary tract infection   . Preeclampsia   . Pregnancy induced hypertension    With all previous pregnancies  . Spontaneous abortion 06/2015    Past Surgical History:  Past Surgical History:  Procedure Laterality Date  . NO PAST SURGERIES      Gynecologic History: Patient's last menstrual period was 11/30/2019.  Obstetric History: [id number]  Family History:  Family History  Problem Relation Age of Onset  . Hypertension Mother   . Heart disease Father   . Cancer Maternal Aunt   . Cancer Paternal Grandmother   . Diabetes Maternal Grandmother     Social History:    Social History   Socioeconomic History  . Marital status: Single    Spouse name: Not on file  . Number of children: 3  . Years of education: 60  . Highest education level: Not on file  Occupational History  . Not on file  Tobacco Use  . Smoking status: Former Smoker    Packs/day: 0.50    Years: 12.00    Pack years: 6.00    Types: Cigarettes  . Smokeless tobacco: Never Used  Vaping Use  . Vaping Use: Never used  Substance and Sexual Activity  . Alcohol use: Not Currently    Comment: occasionally not since pregnancy  . Drug use: Yes    Types: Marijuana, Cocaine    Comment: Last marijuana use 2 weeks ago. Last cocaine use 08/2019-  . Sexual activity: Yes    Birth control/protection: None    Comment: Last BCM was Depo ~ 2 years ago.  Other Topics Concern  . Not on file  Social History Narrative  . Not on file   Social Determinants of Health   Financial Resource Strain:   . Difficulty of Paying Living Expenses:   Food Insecurity:   . Worried About Programme researcher, broadcasting/film/video in the Last Year:   . Barista in the Last Year:   Transportation Needs:   . Freight forwarder (Medical):   Marland Kitchen Lack of Transportation (Non-Medical):   Physical Activity:   . Days of Exercise per Week:   . Minutes of Exercise per Session:   Stress:   . Feeling of Stress :   Social Connections:   . Frequency of Communication with Friends and Family:   . Frequency of Social Gatherings with Friends and Family:   . Attends Religious Services:   . Active Member of Clubs or Organizations:   . Attends Banker Meetings:   Marland Kitchen Marital Status:   Intimate Partner Violence: Not At Risk  . Fear of Current or Ex-Partner: No  . Emotionally Abused: No  . Physically Abused: No  . Sexually Abused: No    Allergies:  No Known Allergies  Medications: Prior to Admission medications   Medication Sig Start Date End Date Taking? Authorizing Provider  Prenatal Vit-Fe Fumarate-FA (PRENATAL  VITAMIN) 27-0.8 MG TABS Take 1 tablet by mouth daily at 6 (six) AM. 01/25/20  Yes Federico Flake, MD  acetaminophen (TYLENOL) 325 MG tablet Take 650 mg by mouth every 6 (six) hours as needed for mild pain or headache. Reported on 03/19/2016 Patient not taking: Reported on 03/16/2020    [provider]  aspirin EC 81 MG tablet Take 1 tablet (81 mg total) by mouth daily. Take after 12 weeks for prevention of preeclampssia later in pregnancy Patient not taking: Reported on 01/25/2020 12/10/17   Oswaldo Conroy, CNM    Physical  Exam Vitals: Blood pressure 126/88, weight (!) 329 lb (149.2 kg), last menstrual period 11/30/2019, not currently breastfeeding.  General: NAD HEENT: normocephalic, anicteric Thyroid: no enlargement, no palpable nodules Pulmonary: No increased work of breathing, CTAB Cardiovascular: RRR, distal pulses 2+ Abdomen: NABS, soft, non-tender, non-distended.  Umbilicus without lesions.  No hepatomegaly, splenomegaly or masses palpable. No evidence of hernia  Genitourinary:  External: Normal external female genitalia.  Normal urethral meatus, normal  Bartholin's and Skene's glands.    Vagina: Normal vaginal mucosa, no evidence of prolapse.    Cervix: Grossly normal in appearance, no bleeding  Uterus: difficult to access due to habitus, Non-enlarged, mobile, normal contour.  No CMT  Adnexa: ovaries non-enlarged, no adnexal masses  Rectal: deferred Extremities: no edema, erythema, or tenderness Neurologic: Grossly intact Psychiatric: mood appropriate, affect full   Assessment: 29 y.o. G4W1027 at [redacted]w[redacted]d presenting to initiate prenatal care  Plan: 1) Avoid alcoholic beverages. 2) Patient encouraged not to smoke.  3) Discontinue the use of all non-medicinal drugs and chemicals.  4) Take prenatal vitamins daily.  5) Nutrition, food safety (fish, cheese advisories, and high nitrite foods) and exercise discussed. 6) Hospital and practice style discussed with cross  coverage system.  7) Genetic Screening, such as with 1st Trimester Screening, cell free fetal DNA, AFP testing, and Ultrasound, as well as with amniocentesis and CVS as appropriate, is discussed with patient. At the conclusion of today's visit patient requested genetic testing 8) Patient is asked about travel to areas at risk for the Zika virus, and counseled to avoid travel and exposure to mosquitoes or sexual partners who may have themselves been exposed to the virus. Testing is discussed, and will be ordered as appropriate.   Due to her BMI (62) it is likely that , combined with her hx of HTN, and other risk factors (late to care, MJ exposure), she will need to be transferred. She delivered at [facility] last pregnancy, and is amenable to going there. OB labs drawn today, along with a pap smear, and cultures.  She is scheduled for a visit in a week for a dating and anatomy scan ultrasound. Mirna Mires, CNM  03/16/2020 5:27 PM

## 2020-03-16 NOTE — Progress Notes (Signed)
NOB, COP at [facility]. LMP end of march

## 2020-03-17 LAB — RPR+RH+ABO+RUB AB+AB SCR+CB...
Antibody Screen: NEGATIVE
HIV Screen 4th Generation wRfx: NONREACTIVE
Hematocrit: 35.1 % (ref 34.0–46.6)
Hemoglobin: 11.5 g/dL (ref 11.1–15.9)
Hepatitis B Surface Ag: NEGATIVE
MCH: 27.7 pg (ref 26.6–33.0)
MCHC: 32.8 g/dL (ref 31.5–35.7)
MCV: 85 fL (ref 79–97)
Platelets: 281 10*3/uL (ref 150–450)
RBC: 4.15 x10E6/uL (ref 3.77–5.28)
RDW: 13.3 % (ref 11.7–15.4)
RPR Ser Ql: NONREACTIVE
Rh Factor: POSITIVE
Rubella Antibodies, IGG: 5.73 index (ref >0.99)
Varicella zoster IgG: 355 index (ref >165)
WBC: 11.4 10*3/uL — ABNORMAL HIGH (ref 3.4–10.8)

## 2020-03-19 LAB — IGP,CTNGTV,RFX APTIMA HPV ASCU
Chlamydia, Nuc. Acid Amp: NEGATIVE
Gonococcus, Nuc. Acid Amp: NEGATIVE
Trich vag by NAA: NEGATIVE

## 2020-03-24 ENCOUNTER — Ambulatory Visit (INDEPENDENT_AMBULATORY_CARE_PROVIDER_SITE_OTHER): Payer: Medicaid Other

## 2020-03-24 ENCOUNTER — Encounter: Payer: Self-pay | Admitting: Advanced Practice Midwife

## 2020-03-24 ENCOUNTER — Other Ambulatory Visit: Payer: Self-pay

## 2020-03-24 ENCOUNTER — Ambulatory Visit (INDEPENDENT_AMBULATORY_CARE_PROVIDER_SITE_OTHER): Payer: Medicaid Other | Admitting: Advanced Practice Midwife

## 2020-03-24 VITALS — BP 124/72 | Ht 61.0 in | Wt 332.8 lb

## 2020-03-24 DIAGNOSIS — O99212 Obesity complicating pregnancy, second trimester: Secondary | ICD-10-CM | POA: Diagnosis not present

## 2020-03-24 DIAGNOSIS — Z3A21 21 weeks gestation of pregnancy: Secondary | ICD-10-CM

## 2020-03-24 DIAGNOSIS — IMO0002 Reserved for concepts with insufficient information to code with codable children: Secondary | ICD-10-CM

## 2020-03-24 DIAGNOSIS — O99210 Obesity complicating pregnancy, unspecified trimester: Secondary | ICD-10-CM

## 2020-03-24 DIAGNOSIS — O162 Unspecified maternal hypertension, second trimester: Secondary | ICD-10-CM | POA: Diagnosis not present

## 2020-03-24 DIAGNOSIS — Z0489 Encounter for examination and observation for other specified reasons: Secondary | ICD-10-CM

## 2020-03-24 DIAGNOSIS — O09292 Supervision of pregnancy with other poor reproductive or obstetric history, second trimester: Secondary | ICD-10-CM | POA: Diagnosis not present

## 2020-03-24 DIAGNOSIS — O0992 Supervision of high risk pregnancy, unspecified, second trimester: Secondary | ICD-10-CM

## 2020-03-24 DIAGNOSIS — O219 Vomiting of pregnancy, unspecified: Secondary | ICD-10-CM

## 2020-03-24 DIAGNOSIS — Z3201 Encounter for pregnancy test, result positive: Secondary | ICD-10-CM

## 2020-03-24 DIAGNOSIS — O0990 Supervision of high risk pregnancy, unspecified, unspecified trimester: Secondary | ICD-10-CM

## 2020-03-24 LAB — POCT URINALYSIS DIPSTICK OB
Glucose, UA: NEGATIVE
POC,PROTEIN,UA: NEGATIVE

## 2020-03-24 MED ORDER — FOLIC ACID 1 MG PO TABS: 1.0000 mg | ORAL_TABLET | Freq: Every day | ORAL | 10 refills | Status: DC

## 2020-03-24 MED ORDER — COMPLETENATE 29-1 MG PO CHEW: 1.0000 | CHEWABLE_TABLET | Freq: Every day | ORAL | 8 refills | Status: AC

## 2020-03-24 MED ORDER — ONDANSETRON 4 MG PO TBDP: 4.0000 mg | ORAL_TABLET | Freq: Three times a day (TID) | ORAL | 1 refills | Status: DC | PRN

## 2020-03-24 NOTE — Progress Notes (Signed)
  Routine Prenatal Care Visit  Subjective  Robin [name] is a 29 y.o. [phone] at [redacted]w[redacted]d being seen today for ongoing prenatal care.  She is currently monitored for the following issues for this high-risk pregnancy and has Obesity affecting pregnancy; History of gestational hypertension; Supervision of high risk pregnancy, antepartum; Chronic hypertension; History of substance use disorder; and Anxiety and depression on their problem list.  ----------------------------------------------------------------------------------- Patient reports some ongoing nausea. She requests anti-emetic. We discussed dating/anatomy scan and follow up anatomy with MFM. We also discussed high risk pregnancy and additional testing/management and possible transfer to tertiary care center.  . Vag. Bleeding: None.  Movement: Present. Leaking Fluid denies.  ----------------------------------------------------------------------------------- The following portions of the patient's history were reviewed and updated as appropriate: allergies, current medications, past family history, past medical history, past social history, past surgical history and problem list. Problem list updated.  Objective  Blood pressure 124/72, height 5\' 1"  (1.549 m), weight (!) 332 lb 12.8 oz (151 kg), last menstrual period 11/30/2019, not currently breastfeeding. Pregravid weight 260 lb (117.9 kg) Total Weight Gain 72 lb 12.8 oz (33 kg) Urinalysis: Urine Protein Negative  Urine Glucose Negative  Fetal Status: Fetal Heart Rate (bpm): 146   Movement: Present      Anatomy/Dating: EDD adjusted for 4 week difference, Anatomy incomplete for PCI, profile, AA, DA, RVOT, LVOT, 4CH, 3VV and otherwise normal, female, placenta posterior  General:  Alert, oriented and cooperative. Patient is in no acute distress.  Skin: Skin is warm and dry. No rash noted.   Cardiovascular: Normal heart rate noted  Respiratory: Normal respiratory effort, no problems with  respiration noted  Abdomen: Soft, gravid, appropriate for gestational age. Pain/Pressure: Absent     Pelvic:  Cervical exam deferred        Extremities: Normal range of motion.     Mental Status: Normal mood and affect. Normal behavior. Normal judgment and thought content.   Assessment   29 y.o. 37 at [redacted]w[redacted]d by  08/02/2020, by Ultrasound presenting for routine prenatal visit  Plan  Start taking baby ASA daily until 36 weeks Rx zofran Rx PNV/folic acid  Will need Hgb A1C at 25 week visit MFM referral for follow up anatomy   Preterm labor symptoms and general obstetric precautions including but not limited to vaginal bleeding, contractions, leaking of fluid and fetal movement were reviewed in detail with the patient.   Return in about 4 weeks (around 04/21/2020) for rob.  04/23/2020, CNM 03/24/2020 12:02 PM

## 2020-03-25 LAB — URINE DRUG PANEL 7
Amphetamines, Urine: NEGATIVE ng/mL
Barbiturate Quant, Ur: NEGATIVE ng/mL
Benzodiazepine Quant, Ur: NEGATIVE ng/mL
Cannabinoid Quant, Ur: POSITIVE — AB
Cocaine (Metab.): NEGATIVE ng/mL
Opiate Quant, Ur: NEGATIVE ng/mL
PCP Quant, Ur: NEGATIVE ng/mL

## 2020-03-28 ENCOUNTER — Other Ambulatory Visit: Payer: Self-pay | Admitting: Advanced Practice Midwife

## 2020-03-28 DIAGNOSIS — IMO0002 Reserved for concepts with insufficient information to code with codable children: Secondary | ICD-10-CM

## 2020-03-28 DIAGNOSIS — O99212 Obesity complicating pregnancy, second trimester: Secondary | ICD-10-CM

## 2020-04-06 ENCOUNTER — Ambulatory Visit: Payer: Medicaid Other

## 2020-04-10 ENCOUNTER — Institutional Professional Consult (permissible substitution): Payer: Self-pay

## 2020-04-10 ENCOUNTER — Other Ambulatory Visit: Payer: Self-pay

## 2020-04-21 ENCOUNTER — Encounter: Payer: Self-pay | Admitting: Obstetrics and Gynecology

## 2020-05-01 ENCOUNTER — Encounter: Payer: Self-pay | Admitting: Obstetrics

## 2020-05-02 ENCOUNTER — Telehealth: Payer: Self-pay | Admitting: Obstetrics

## 2020-05-02 NOTE — Telephone Encounter (Signed)
Patient no showed schedule appointments for 04/21/20 and 05/01/20. Called and left voicemail for patient to call back to be scheduled.

## 2020-05-02 NOTE — Telephone Encounter (Signed)
Patient is scheduled for 05/05/20 with SDJ. Patient reports having transportation issues.

## 2020-05-05 ENCOUNTER — Other Ambulatory Visit: Payer: Self-pay

## 2020-05-05 ENCOUNTER — Ambulatory Visit (INDEPENDENT_AMBULATORY_CARE_PROVIDER_SITE_OTHER): Payer: Medicaid Other | Admitting: Obstetrics and Gynecology

## 2020-05-05 ENCOUNTER — Telehealth: Payer: Self-pay

## 2020-05-05 ENCOUNTER — Encounter: Payer: Self-pay | Admitting: Obstetrics and Gynecology

## 2020-05-05 VITALS — BP 126/84 | Wt 334.0 lb

## 2020-05-05 DIAGNOSIS — Z113 Encounter for screening for infections with a predominantly sexual mode of transmission: Secondary | ICD-10-CM

## 2020-05-05 DIAGNOSIS — O99213 Obesity complicating pregnancy, third trimester: Secondary | ICD-10-CM

## 2020-05-05 DIAGNOSIS — Z3A27 27 weeks gestation of pregnancy: Secondary | ICD-10-CM

## 2020-05-05 DIAGNOSIS — O10919 Unspecified pre-existing hypertension complicating pregnancy, unspecified trimester: Secondary | ICD-10-CM

## 2020-05-05 DIAGNOSIS — Z131 Encounter for screening for diabetes mellitus: Secondary | ICD-10-CM

## 2020-05-05 DIAGNOSIS — Z8759 Personal history of other complications of pregnancy, childbirth and the puerperium: Secondary | ICD-10-CM

## 2020-05-05 DIAGNOSIS — O0993 Supervision of high risk pregnancy, unspecified, third trimester: Secondary | ICD-10-CM

## 2020-05-05 HISTORY — DX: Unspecified pre-existing hypertension complicating pregnancy, unspecified trimester: O10.919

## 2020-05-05 NOTE — Progress Notes (Signed)
Routine Prenatal Care Visit  Subjective  Robin [name] is a 29 y.o. [phone] at [redacted]w[redacted]d being seen today for ongoing prenatal care.  She is currently monitored for the following issues for this high-risk pregnancy and has Obesity affecting pregnancy; History of gestational hypertension; Supervision of high risk pregnancy, antepartum; Chronic hypertension; History of substance use disorder; Anxiety and depression; and Chronic hypertension affecting pregnancy on their problem list.  ----------------------------------------------------------------------------------- Patient reports no complaints.    . Vag. Bleeding: None.  Movement: Present. Leaking Fluid denies.  ----------------------------------------------------------------------------------- The following portions of the patient's history were reviewed and updated as appropriate: allergies, current medications, past family history, past medical history, past social history, past surgical history and problem list. Problem list updated.  Objective  Blood pressure 126/84, weight (!) 334 lb (151.5 kg), last menstrual period 11/30/2019. Pregravid weight 260 lb (117.9 kg) Total Weight Gain 74 lb (33.6 kg) Urinalysis: Urine Protein    Urine Glucose    Fetal Status: Fetal Heart Rate (bpm): 140   Movement: Present     General:  Alert, oriented and cooperative. Patient is in no acute distress.  Skin: Skin is warm and dry. No rash noted.   Cardiovascular: Normal heart rate noted  Respiratory: Normal respiratory effort, no problems with respiration noted  Abdomen: Soft, gravid, appropriate for gestational age. Pain/Pressure: Absent     Pelvic:  Cervical exam deferred        Extremities: Normal range of motion.     Mental Status: Normal mood and affect. Normal behavior. Normal judgment and thought content.   Assessment   29 y.o. [id number] at [redacted]w[redacted]d by  08/02/2020, by Ultrasound presenting for routine prenatal visit  Plan   Pregnancy 2021 Problems  (from 02/07/20 to present)    Problem Noted Resolved   Chronic hypertension affecting pregnancy 05/05/2020 by Conard Novak, MD No   Supervision of high risk pregnancy, antepartum 03/16/2020 by Mirna Mires, CNM No   Overview Signed 03/16/2020  2:37 PM by Mirna Mires, CNM     Clinic  Prenatal Labs ordered 7/15  Dating  ordered 03/16/20 Blood type:     Genetic Screen 1 Screen:    AFP:     Quad:     NIPS: Antibody:   Anatomic [place]  Rubella:    GTT Early:               Third trimester:  RPR:     Flu vaccine  HBsAg:     TDaP vaccine                                               Rhogam: HIV:     Baby Food                                               GBS: (For PCN allergy, check sensitivities)  Contraception  Pap:  Circumcision    Pediatrician    Support Person                  Preterm labor symptoms and general obstetric precautions including but not limited to vaginal bleeding, contractions, leaking of fluid and fetal movement were reviewed in detail with the patient. Please refer to After Visit  Summary for other counseling recommendations.   - Patient needs 28 week labs ASAP - Discussed need for follow up ultrasound with MFM including growth and consultation regarding location of delivery.   - If to be delivered at [facility], will need a consultation with Anesthesia at 32-34 weeks.  Return in about 2 weeks (around 05/19/2020) for 28 week labs ASAP and Routine Prenatal Appointment 2 weeks (4 if goes to MFM).  Thomasene Mohair, MD, [facility], [facility] 05/06/2020 12:43 AM

## 2020-05-05 NOTE — Telephone Encounter (Signed)
PT did not show for MFM appointment on 8/17. Pt saw Dr. Jean Rosenthal today and number for MFM was given to pt by me. Pt understands she needs to call and schedule an appointment

## 2020-05-22 ENCOUNTER — Other Ambulatory Visit: Payer: Medicaid Other

## 2020-05-22 ENCOUNTER — Encounter: Payer: Medicaid Other | Admitting: Obstetrics

## 2020-05-25 ENCOUNTER — Ambulatory Visit: Payer: Medicaid Other | Attending: Maternal & Fetal Medicine

## 2020-05-25 ENCOUNTER — Ambulatory Visit (HOSPITAL_BASED_OUTPATIENT_CLINIC_OR_DEPARTMENT_OTHER): Payer: Medicaid Other | Admitting: Maternal & Fetal Medicine

## 2020-05-25 ENCOUNTER — Other Ambulatory Visit: Payer: Self-pay

## 2020-05-25 DIAGNOSIS — O10919 Unspecified pre-existing hypertension complicating pregnancy, unspecified trimester: Secondary | ICD-10-CM

## 2020-05-25 DIAGNOSIS — Z8759 Personal history of other complications of pregnancy, childbirth and the puerperium: Secondary | ICD-10-CM | POA: Insufficient documentation

## 2020-05-25 DIAGNOSIS — Z87891 Personal history of nicotine dependence: Secondary | ICD-10-CM | POA: Diagnosis not present

## 2020-05-25 DIAGNOSIS — O99212 Obesity complicating pregnancy, second trimester: Secondary | ICD-10-CM

## 2020-05-25 DIAGNOSIS — O10913 Unspecified pre-existing hypertension complicating pregnancy, third trimester: Secondary | ICD-10-CM | POA: Diagnosis not present

## 2020-05-25 DIAGNOSIS — IMO0002 Reserved for concepts with insufficient information to code with codable children: Secondary | ICD-10-CM

## 2020-05-25 DIAGNOSIS — O0933 Supervision of pregnancy with insufficient antenatal care, third trimester: Secondary | ICD-10-CM | POA: Diagnosis not present

## 2020-05-25 DIAGNOSIS — Z8249 Family history of ischemic heart disease and other diseases of the circulatory system: Secondary | ICD-10-CM | POA: Diagnosis not present

## 2020-05-25 DIAGNOSIS — Z3A30 30 weeks gestation of pregnancy: Secondary | ICD-10-CM | POA: Insufficient documentation

## 2020-05-25 DIAGNOSIS — O09293 Supervision of pregnancy with other poor reproductive or obstetric history, third trimester: Secondary | ICD-10-CM | POA: Insufficient documentation

## 2020-05-25 DIAGNOSIS — O99213 Obesity complicating pregnancy, third trimester: Secondary | ICD-10-CM | POA: Diagnosis not present

## 2020-05-25 DIAGNOSIS — O113 Pre-existing hypertension with pre-eclampsia, third trimester: Secondary | ICD-10-CM | POA: Diagnosis not present

## 2020-05-25 DIAGNOSIS — O0990 Supervision of high risk pregnancy, unspecified, unspecified trimester: Secondary | ICD-10-CM

## 2020-05-25 NOTE — Progress Notes (Signed)
MFM consultation    Date of service: 05/25/20 Reason for request: Obesity and chronic hypertension Requesting provider: Thomasene Mohair, MD  Robin [name] is a G4P3 who is here at 48 w 1 d in consultation at the request of Thomasene Mohair, MD regarding management of morbid obesity and chronic hypertension.  She is overall doing well without sign or symptoms of preeclampsia or preterm labor.  She is late to care initiated at 29 w 2 d which established dating of 08/02/20 and is not consistent with her LMP.  BP 131/78 (BP Location: Left Arm)   Pulse (!) 112   Temp 98.2 F (36.8 C) (Oral)   Resp 18   Ht 5' (1.524 m)   Wt (!) 150.8 kg   LMP 11/30/2019   SpO2 99%   BMI 64.94 kg/m   Vitals with BMI 05/25/2020 05/05/2020 03/24/2020  Height 5\' 0"  - 5\' 1"   Weight 332 lbs 8 oz 334 lbs 332 lbs 13 oz  BMI 64.94 - 62.91  Systolic 131 126  Diastolic 78 84 72  Pulse 112 - -   CBC Latest Ref Rng & Units 03/16/2020 11/11/2017 04/19/2016  WBC 3.4 - 10.8 x10E3/uL 11.4(H) 9.9 23.1(H)  Hemoglobin 11.1 - 15.9 g/dL 01/11/2018 11.0(L) 13.4  Hematocrit 34.0 - 46.6 % 35.1 33.9(L) 38.8  Platelets 150 - 450 x10E3/uL 281 294 211   CMP Latest Ref Rng & Units 11/11/2017 04/18/2016 04/01/2016  Glucose 65 - 99 mg/dL 76 04/20/2016) 75  BUN 6 - 20 mg/dL 9 13 10   Creatinine 0.57 - 1.00 mg/dL 04/03/2016 409(W  Sodium 134 - 144 mmol/L 139 134(L) 136  Potassium 3.5 - 5.2 mmol/L 4.0 3.8 4.2  Chloride 96 - 106 mmol/L 107(H) 107 107  CO2 20 - 29 mmol/L 19(L) 20(L) 21  Calcium 8.7 - 10.2 mg/dL 9.3 1.19) 8.7  Total Protein 6.0 - 8.5 g/dL 6.1 1.47) 8.29)  Total Bilirubin 0.0 - 1.2 mg/dL 5.6(O 1.3(Y) 0.2  Alkaline Phos 39 - 117 IU/L 67 148(H) 143(H)  AST 0 - 40 IU/L 9 19 12   ALT 0 - 32 IU/L 12 28 10    OB History  Gravida Para Term Preterm AB Living  5 3 2 1 1 3   SAB TAB Ectopic Multiple Live Births  1 0 0 0 3    # Outcome Date GA Lbr Len/2nd Weight Sex Delivery Anes PTL Lv  5 Current           4 Term 04/30/18 [redacted]w[redacted]d   F     LIV  3 Preterm 04/19/16 [redacted]w[redacted]d 02:41 / 00:09 1370 g F Vag-Spont EPI  LIV     Complications: Preeclampsia  2 SAB 06/27/15          1 Term 08/10/07 [redacted]w[redacted]d  2438 g F Vag-Spont EPI N LIV   Past Medical History:  Diagnosis Date  . Anemia    4th pregnancy  . Asthma    No Inhaler  . Depression    Hx of Depression, refused medication.  [redacted]w[redacted]d History of urinary tract infection   . Preeclampsia   . Pregnancy induced hypertension    With all previous pregnancies  . Spontaneous abortion 06/2015   Past Surgical History:  Procedure Laterality Date  . NO PAST SURGERIES     Social History   Socioeconomic History  . Marital status: Significant Other    Spouse name: Not on file  . Number of children: 3  . Years of education: 61  . Highest education  level: Not on file  Occupational History  . Not on file  Tobacco Use  . Smoking status: Former Smoker    Packs/day: 0.50    Years: 12.00    Pack years: 6.00    Types: Cigarettes  . Smokeless tobacco: Never Used  Vaping Use  . Vaping Use: Never used  Substance and Sexual Activity  . Alcohol use: Not Currently    Comment: occasionally not since pregnancy  . Drug use: Yes    Types: Marijuana, Cocaine    Comment: Last marijuana use 2 weeks ago. Last cocaine use 08/2019-  . Sexual activity: Yes    Birth control/protection: None    Comment: Last BCM was Depo ~ 2 years ago.  Other Topics Concern  . Not on file  Social History Narrative  . Not on file   Social Determinants of Health   Financial Resource Strain:   . Difficulty of Paying Living Expenses: Not on file  Food Insecurity:   . Worried About Programme researcher, broadcasting/film/video in the Last Year: Not on file  . Ran Out of Food in the Last Year: Not on file  Transportation Needs:   . Lack of Transportation (Medical): Not on file  . Lack of Transportation (Non-Medical): Not on file  Physical Activity:   . Days of Exercise per Week: Not on file  . Minutes of Exercise per Session: Not on file   Stress:   . Feeling of Stress : Not on file  Social Connections:   . Frequency of Communication with Friends and Family: Not on file  . Frequency of Social Gatherings with Friends and Family: Not on file  . Attends Religious Services: Not on file  . Active Member of Clubs or Organizations: Not on file  . Attends Banker Meetings: Not on file  . Marital Status: Not on file  Intimate Partner Violence: Not At Risk  . Fear of Current or Ex-Partner: No  . Emotionally Abused: No  . Physically Abused: No  . Sexually Abused: No   Current Outpatient Medications on File Prior to Visit  Medication Sig Dispense Refill  . acetaminophen (TYLENOL) 325 MG tablet Take 650 mg by mouth every 6 (six) hours as needed for mild pain or headache. Reported on 03/19/2016    . aspirin EC 81 MG tablet Take 1 tablet (81 mg total) by mouth daily. Take after 12 weeks for prevention of preeclampssia later in pregnancy 300 tablet 2  . folic acid (FOLVITE) 1 MG tablet Take 1 tablet (1 mg total) by mouth daily. 30 tablet 10  . ondansetron (ZOFRAN ODT) 4 MG disintegrating tablet Take 1 tablet (4 mg total) by mouth every 8 (eight) hours as needed for nausea. 20 tablet 1  . prenatal vitamin w/FE, FA (NATACHEW) 29-1 MG CHEW chewable tablet Chew 1 tablet by mouth daily at 12 noon. 30 tablet 8   No current facility-administered medications on file prior to visit.   No Known Allergies Family History  Problem Relation Age of Onset  . Hypertension Mother   . Heart disease Father   . Cancer Maternal Aunt   . Cancer Paternal Grandmother   . Diabetes Maternal Grandmother    Imaging:  Normal anatomy however, suboptimal views of the fetal anatomy were obtained secondary to fetal position and maternal obesity EFW 1151 g 35%. 30w 1 d    Impression/Counseling:  Chronic hypertension Robin Liebert has chronic hypertension not on medical therapy. Her blood pressure today was normal. She  had a prior pregnancy with  preeclampsia. She is overall doing well without sign or symptoms of preeclampsia. She is taking daily low dose ASA.  We discussed her 25%  increased risk for preeclampsia during this pregnancy. Her blood pressure is stable. I recommend continue serial growth exams every 4 weeks. Initiate weekly testing at 36 weeks. If her blood pressure persist > 150 or >105 consider initiating antihypertensive therapy.   Morbid Obesity I discussed the increased risk for gestational diabetes, hypertension in pregnancy, cesarean delivery and thrombosis post delivery. We discussed secondary risk of fetal macrosomia.  Her current growth is normal.   In addition, I discussed total weight gain of 11-20 lbs.  With regard to delivery she may deliver at [facility] and agree with an predelivery anesthesia appointment at 37 weeks to determine their comfort level. Otherwise consider delivery at [facility] in [place].  Overall given Robin. Beaufords weight and Chronic hypertension she is very aware of the risk and is doing her best to comply with overall recommendations.   Follow up growth in 4 weeks to complete fetal anatomy.  I spent 30 minutes with 50% in face to face consultation and care coordination  Robin. Minkin had no further questions.  Robin [name] J.Azalie Harbeck, MD.

## 2020-05-31 ENCOUNTER — Encounter: Payer: Medicaid Other | Admitting: Obstetrics

## 2020-05-31 ENCOUNTER — Other Ambulatory Visit: Payer: Medicaid Other

## 2020-06-06 ENCOUNTER — Telehealth: Payer: Self-pay | Admitting: Obstetrics

## 2020-06-06 NOTE — Telephone Encounter (Signed)
Patient is scheduled for 06/12/20 °

## 2020-06-06 NOTE — Telephone Encounter (Signed)
Contacted patient to reschedule lab and routine ob follow up. Left voicemail for patient to call back to be scheduled.

## 2020-06-11 ENCOUNTER — Other Ambulatory Visit: Payer: Self-pay | Admitting: Obstetrics and Gynecology

## 2020-06-11 DIAGNOSIS — Z3A32 32 weeks gestation of pregnancy: Secondary | ICD-10-CM

## 2020-06-11 DIAGNOSIS — O10919 Unspecified pre-existing hypertension complicating pregnancy, unspecified trimester: Secondary | ICD-10-CM

## 2020-06-11 DIAGNOSIS — O99213 Obesity complicating pregnancy, third trimester: Secondary | ICD-10-CM

## 2020-06-11 DIAGNOSIS — O0993 Supervision of high risk pregnancy, unspecified, third trimester: Secondary | ICD-10-CM

## 2020-06-12 ENCOUNTER — Other Ambulatory Visit: Payer: Medicaid Other

## 2020-06-12 ENCOUNTER — Encounter: Payer: Medicaid Other | Admitting: Obstetrics

## 2020-06-15 ENCOUNTER — Other Ambulatory Visit: Payer: Self-pay | Admitting: Advanced Practice Midwife

## 2020-06-15 DIAGNOSIS — O99213 Obesity complicating pregnancy, third trimester: Secondary | ICD-10-CM

## 2020-06-22 ENCOUNTER — Ambulatory Visit: Payer: Medicaid Other | Attending: Obstetrics

## 2020-06-22 ENCOUNTER — Other Ambulatory Visit: Payer: Self-pay

## 2020-06-22 DIAGNOSIS — O0990 Supervision of high risk pregnancy, unspecified, unspecified trimester: Secondary | ICD-10-CM

## 2020-06-22 DIAGNOSIS — E669 Obesity, unspecified: Secondary | ICD-10-CM | POA: Insufficient documentation

## 2020-06-22 DIAGNOSIS — Z3A34 34 weeks gestation of pregnancy: Secondary | ICD-10-CM | POA: Diagnosis not present

## 2020-06-22 DIAGNOSIS — O10919 Unspecified pre-existing hypertension complicating pregnancy, unspecified trimester: Secondary | ICD-10-CM

## 2020-06-22 DIAGNOSIS — O99213 Obesity complicating pregnancy, third trimester: Secondary | ICD-10-CM | POA: Insufficient documentation

## 2020-06-22 DIAGNOSIS — O133 Gestational [pregnancy-induced] hypertension without significant proteinuria, third trimester: Secondary | ICD-10-CM | POA: Diagnosis not present

## 2020-07-03 ENCOUNTER — Other Ambulatory Visit: Payer: Self-pay | Admitting: Advanced Practice Midwife

## 2020-07-03 DIAGNOSIS — O133 Gestational [pregnancy-induced] hypertension without significant proteinuria, third trimester: Secondary | ICD-10-CM

## 2020-07-03 DIAGNOSIS — O99213 Obesity complicating pregnancy, third trimester: Secondary | ICD-10-CM

## 2020-07-06 ENCOUNTER — Other Ambulatory Visit: Payer: Medicaid Other

## 2020-07-06 NOTE — Progress Notes (Deleted)
Pt was a "No Show" for appt with MFM @ [facility] on 07/06/20.

## 2020-07-10 ENCOUNTER — Other Ambulatory Visit: Payer: Medicaid Other

## 2020-07-10 ENCOUNTER — Encounter: Payer: Medicaid Other | Admitting: Obstetrics

## 2020-07-11 ENCOUNTER — Other Ambulatory Visit: Payer: Medicaid Other

## 2020-07-11 ENCOUNTER — Ambulatory Visit (INDEPENDENT_AMBULATORY_CARE_PROVIDER_SITE_OTHER): Payer: Medicaid Other | Admitting: Obstetrics

## 2020-07-11 ENCOUNTER — Other Ambulatory Visit: Payer: Self-pay

## 2020-07-11 VITALS — BP 130/80 | Ht 60.0 in | Wt 335.0 lb

## 2020-07-11 DIAGNOSIS — O10919 Unspecified pre-existing hypertension complicating pregnancy, unspecified trimester: Secondary | ICD-10-CM

## 2020-07-11 DIAGNOSIS — N898 Other specified noninflammatory disorders of vagina: Secondary | ICD-10-CM

## 2020-07-11 DIAGNOSIS — O0990 Supervision of high risk pregnancy, unspecified, unspecified trimester: Secondary | ICD-10-CM

## 2020-07-11 DIAGNOSIS — Z113 Encounter for screening for infections with a predominantly sexual mode of transmission: Secondary | ICD-10-CM

## 2020-07-11 DIAGNOSIS — O0993 Supervision of high risk pregnancy, unspecified, third trimester: Secondary | ICD-10-CM

## 2020-07-11 DIAGNOSIS — O99213 Obesity complicating pregnancy, third trimester: Secondary | ICD-10-CM

## 2020-07-11 DIAGNOSIS — Z131 Encounter for screening for diabetes mellitus: Secondary | ICD-10-CM

## 2020-07-11 DIAGNOSIS — Z3A36 36 weeks gestation of pregnancy: Secondary | ICD-10-CM

## 2020-07-11 DIAGNOSIS — O26893 Other specified pregnancy related conditions, third trimester: Secondary | ICD-10-CM | POA: Insufficient documentation

## 2020-07-11 LAB — POCT URINALYSIS DIPSTICK OB: Glucose, UA: NEGATIVE

## 2020-07-11 LAB — FETAL NONSTRESS TEST

## 2020-07-11 NOTE — Progress Notes (Signed)
Routine Prenatal Care Visit  Subjective  Robin [name] is a 29 y.o. [phone] at [redacted]w[redacted]d being seen today for ongoing prenatal care.  She is currently monitored for the following issues for this high-risk pregnancy and has Obesity affecting pregnancy; History of gestational hypertension; Supervision of high risk pregnancy, antepartum; Chronic hypertension; History of substance use disorder; Anxiety and depression; and Chronic hypertension affecting pregnancy on their problem list. Due to lack of trasnportation, and the driving service consistently failing to pick her up, Robin [name] has missed numerous appointments.----------------------------------------------------------------------------------- Patient reports no complaints.  She denies any headaches, blurred vision or decreased fetal movement. Contractions: Irregular. Vag. Bleeding: None.  Movement: Present. Leaking Fluid denies.  ----------------------------------------------------------------------------------- The following portions of the patient's history were reviewed and updated as appropriate: allergies, current medications, past family history, past medical history, past social history, past surgical history and problem list. Problem list updated.  Objective  Blood pressure 130/80, height 5' (1.524 m), weight (!) 335 lb (152 kg), last menstrual period 11/30/2019. Pregravid weight 260 lb (117.9 kg) Total Weight Gain 75 lb (34 kg) Urinalysis: Urine Protein    Urine Glucose    Fetal Status:     Movement: Present     General:  Alert, oriented and cooperative. Patient is in no acute distress.  Skin: Skin is warm and dry. No rash noted.   Cardiovascular: Normal heart rate noted  Respiratory: Normal respiratory effort, no problems with respiration noted  Abdomen: Soft, gravid, appropriate for gestational age. Pain/Pressure: Present     Pelvic:  Cervical exam performed      closed/thick/high  ++ vaginal dc and odor noted  Extremities: Normal  range of motion.     Mental Status: Normal mood and affect. Normal behavior. Normal judgment and thought content.   Assessment   29 y.o. [id number] at [redacted]w[redacted]d by  08/02/2020, by Ultrasound presenting for routine prenatal visit Inconsistent care- lack of transportation. Excessive weight gain this pregnancy- 75 lbs. Hx of substance use- admits to +MJ use throughout the pregnancy. Lat third trimester labs drawn today. GBS screening Inconsistent care secondary to lack of transportation. Now has [place] transport set up. Morbid obesity with 75lb weight gain this pregnancy.  Plan   Pregnancy 2021 Problems (from 02/07/20 to present)    Problem Noted Resolved   Chronic hypertension affecting pregnancy 05/05/2020 by Conard Novak, MD No   Supervision of high risk pregnancy, antepartum 03/16/2020 by Mirna Mires, CNM No   Overview Signed 03/16/2020  2:37 PM by Mirna Mires, CNM     Clinic  Prenatal Labs ordered 7/15  Dating  ordered 03/16/20 Blood type:     Genetic Screen 1 Screen:    AFP:     Quad:     NIPS: Antibody:   Anatomic [place]  Rubella:    GTT Early:               Third trimester:  RPR:     Flu vaccine  HBsAg:     TDaP vaccine                                               Rhogam: HIV:     [employer]  GBS: (For PCN allergy, check sensitivities)  Contraception  Pap:  Circumcision    Pediatrician    Support Person                  Term labor symptoms and general obstetric precautions including but not limited to vaginal bleeding, contractions, leaking of fluid and fetal movement were reviewed in detail with the patient. Please refer to After Visit Summary for other counseling recommendations.  Has appointment for preadmission tomorrow. Needs anesthesia consult. MFM followup  On 11/11 for growth scan. 28 week labs drawn today, with HGB A1C. Will need social work consults PP. Addressed the MJ use; advised to cease  using. Nuswab sent  for suspected BV or yeast. Will treat per test results.  Return in about 1 week (around 07/18/2020) for return OB, NST, ULtrasound for growth and AFI.Mirna Mires, CNM  07/11/2020 10:59 AM

## 2020-07-12 ENCOUNTER — Other Ambulatory Visit: Payer: Self-pay | Admitting: Obstetrics

## 2020-07-12 ENCOUNTER — Encounter: Payer: Self-pay | Admitting: Obstetrics

## 2020-07-12 DIAGNOSIS — O0930 Supervision of pregnancy with insufficient antenatal care, unspecified trimester: Secondary | ICD-10-CM | POA: Insufficient documentation

## 2020-07-12 DIAGNOSIS — O0993 Supervision of high risk pregnancy, unspecified, third trimester: Secondary | ICD-10-CM

## 2020-07-12 DIAGNOSIS — N898 Other specified noninflammatory disorders of vagina: Secondary | ICD-10-CM

## 2020-07-12 DIAGNOSIS — O0933 Supervision of pregnancy with insufficient antenatal care, third trimester: Secondary | ICD-10-CM | POA: Insufficient documentation

## 2020-07-12 LAB — 28 WEEK RH+PANEL
Basophils Absolute: 0 10*3/uL (ref 0.0–0.2)
Basos: 0 %
EOS (ABSOLUTE): 0.1 10*3/uL (ref 0.0–0.4)
Eos: 1 %
Gestational Diabetes Screen: 146 mg/dL — ABNORMAL HIGH (ref 65–139)
HIV Screen 4th Generation wRfx: NONREACTIVE
Hematocrit: 35 % (ref 34.0–46.6)
Hemoglobin: 11.4 g/dL (ref 11.1–15.9)
Immature Grans (Abs): 0.1 10*3/uL (ref 0.0–0.1)
Immature Granulocytes: 1 %
Lymphocytes Absolute: 1.7 10*3/uL (ref 0.7–3.1)
Lymphs: 17 %
MCH: 27.5 pg (ref 26.6–33.0)
MCHC: 32.6 g/dL (ref 31.5–35.7)
MCV: 85 fL (ref 79–97)
Monocytes Absolute: 0.4 10*3/uL (ref 0.1–0.9)
Monocytes: 4 %
Neutrophils Absolute: 7.5 10*3/uL — ABNORMAL HIGH (ref 1.4–7.0)
Neutrophils: 77 %
Platelets: 307 10*3/uL (ref 150–450)
RBC: 4.14 x10E6/uL (ref 3.77–5.28)
RDW: 12.8 % (ref 11.7–15.4)
RPR Ser Ql: NONREACTIVE
WBC: 9.7 10*3/uL (ref 3.4–10.8)

## 2020-07-12 LAB — HEMOGLOBIN A1C
Est. average glucose Bld gHb Est-mCnc: 126 mg/dL
Hgb A1c MFr Bld: 6 % — ABNORMAL HIGH (ref 4.8–5.6)

## 2020-07-13 ENCOUNTER — Encounter
Admission: RE | Admit: 2020-07-13 | Discharge: 2020-07-13 | Disposition: A | Discharge status: Home / Self Care | Payer: Medicaid Other | Source: Ambulatory Visit | Attending: Anesthesiology | Admitting: Anesthesiology

## 2020-07-13 ENCOUNTER — Other Ambulatory Visit: Payer: Self-pay

## 2020-07-13 ENCOUNTER — Ambulatory Visit: Payer: Medicaid Other | Attending: Maternal & Fetal Medicine

## 2020-07-13 ENCOUNTER — Other Ambulatory Visit: Payer: Self-pay | Admitting: Obstetrics and Gynecology

## 2020-07-13 DIAGNOSIS — O10013 Pre-existing essential hypertension complicating pregnancy, third trimester: Secondary | ICD-10-CM | POA: Insufficient documentation

## 2020-07-13 DIAGNOSIS — O0990 Supervision of high risk pregnancy, unspecified, unspecified trimester: Secondary | ICD-10-CM

## 2020-07-13 DIAGNOSIS — I10 Essential (primary) hypertension: Secondary | ICD-10-CM

## 2020-07-13 DIAGNOSIS — O24113 Pre-existing diabetes mellitus, type 2, in pregnancy, third trimester: Secondary | ICD-10-CM | POA: Diagnosis not present

## 2020-07-13 DIAGNOSIS — O133 Gestational [pregnancy-induced] hypertension without significant proteinuria, third trimester: Secondary | ICD-10-CM | POA: Diagnosis not present

## 2020-07-13 DIAGNOSIS — Z3A37 37 weeks gestation of pregnancy: Secondary | ICD-10-CM | POA: Insufficient documentation

## 2020-07-13 DIAGNOSIS — O0933 Supervision of pregnancy with insufficient antenatal care, third trimester: Secondary | ICD-10-CM

## 2020-07-13 DIAGNOSIS — O10919 Unspecified pre-existing hypertension complicating pregnancy, unspecified trimester: Secondary | ICD-10-CM

## 2020-07-13 DIAGNOSIS — O99213 Obesity complicating pregnancy, third trimester: Secondary | ICD-10-CM

## 2020-07-13 DIAGNOSIS — N898 Other specified noninflammatory disorders of vagina: Secondary | ICD-10-CM

## 2020-07-13 DIAGNOSIS — O99810 Abnormal glucose complicating pregnancy: Secondary | ICD-10-CM

## 2020-07-13 DIAGNOSIS — Z8759 Personal history of other complications of pregnancy, childbirth and the puerperium: Secondary | ICD-10-CM

## 2020-07-13 LAB — GLUCOSE, CAPILLARY: Glucose-Capillary: 79 mg/dL (ref 70–99)

## 2020-07-13 NOTE — Consult Note (Signed)
[facility] Anesthesia Consultation  Jamee Keach XHB:[id number] DOB: 1991-01-10 DOA: 07/13/2020 PCP: Tresea Mall, CNM   Requesting physician: Dr. Jean Rosenthal Date of consultation: 07/13/20 Reason for consultation: Obesity during pregnancy  CHIEF COMPLAINT:  Obesity during pregnancy  HISTORY OF PRESENT ILLNESS: Robin [name]  is a 29 y.o. female with a known history of morbid obesity, asthma, preeclampsia with at least one prior pregnancy, PIH (reports BP normal so far this pregnancy), and new diagnosis of gestational hypertension.  Pt has missed multiple prenatal visits due to transportation challenges.   She has had two prior successful epidurals.  All three prior deliveries have been vaginal.  No previous problems with anesthesia.  PAST MEDICAL HISTORY:   Past Medical History:  Diagnosis Date  . Anemia    4th pregnancy  . Asthma    No Inhaler  . Depression    Hx of Depression, refused medication.  Marland Kitchen History of urinary tract infection   . Preeclampsia   . Pregnancy induced hypertension    With all previous pregnancies  . Spontaneous abortion 06/2015    PAST SURGICAL HISTORY:  Past Surgical History:  Procedure Laterality Date  . NO PAST SURGERIES      SOCIAL HISTORY:  Social History   Tobacco Use  . Smoking status: Former Smoker    Packs/day: 0.50    Years: 12.00    Pack years: 6.00    Types: Cigarettes  . Smokeless tobacco: Never Used  Substance Use Topics  . Alcohol use: Not Currently    Comment: occasionally not since pregnancy    FAMILY HISTORY:  Family History  Problem Relation Age of Onset  . Hypertension Mother   . Heart disease Father   . Cancer Maternal Aunt   . Cancer Paternal Grandmother   . Diabetes Maternal Grandmother     DRUG ALLERGIES: No Known Allergies  REVIEW OF SYSTEMS:   RESPIRATORY: No cough, shortness of breath, wheezing.  CARDIOVASCULAR: No chest pain, orthopnea, edema.  HEMATOLOGY:  No anemia, easy bruising or bleeding SKIN: No rash or lesion. NEUROLOGIC: No tingling, numbness, weakness.  PSYCHIATRY: No anxiety or depression.   MEDICATIONS AT HOME:  Prior to Admission medications   Medication Sig Start Date End Date Taking? Authorizing Provider  acetaminophen (TYLENOL) 325 MG tablet Take 650 mg by mouth every 6 (six) hours as needed for mild pain or headache. Reported on 03/19/2016    [provider]  aspirin EC 81 MG tablet Take 1 tablet (81 mg total) by mouth daily. Take after 12 weeks for prevention of preeclampssia later in pregnancy 12/10/17   Oswaldo Conroy, CNM  folic acid (FOLVITE) 1 MG tablet Take 1 tablet (1 mg total) by mouth daily. 03/24/20   Tresea Mall, CNM  ondansetron (ZOFRAN ODT) 4 MG disintegrating tablet Take 1 tablet (4 mg total) by mouth every 8 (eight) hours as needed for nausea. 03/24/20   Tresea Mall, CNM  prenatal vitamin w/FE, FA (NATACHEW) 29-1 MG CHEW chewable tablet Chew 1 tablet by mouth daily at 12 noon. 03/24/20   Tresea Mall, CNM      PHYSICAL EXAMINATION:   VITAL SIGNS: Last menstrual period 11/30/2019.  GENERAL:  29 y.o.-year-old patient no acute distress.  HEENT: Head atraumatic, normocephalic. Oropharynx and nasopharynx clear. MP 3, TM distance >3 cm, normal mouth opening. LUNGS: No use of accessory muscles of respiration.   EXTREMITIES: No pedal edema, cyanosis, or clubbing.  NEUROLOGIC: normal gait PSYCHIATRIC: The patient is alert and oriented x 3.  SKIN: No obvious rash, lesion, or ulcer.    IMPRESSION AND PLAN:   [name]  is a 29 y.o. female presenting with morbid obesity during pregnancy. BMI is currently 65 at [redacted] weeks gestation.   We discussed analgesic options during labor including epidural analgesia. Discussed that in obesity there can be increased difficulty with epidural placement or even failure of successful epidural. We also discussed that even after successful epidural placement there  is increased risk of catheter migration out of the epidural space that would require catheter replacement. Discussed use of epidural vs spinal vs GA if cesarean delivery is required. Discussed increased risk of difficult intubation during pregnancy should an emergency cesarean delivery be required.  Plan to deliver at [facility].  Karleen Hampshire, MD Anesthesiology

## 2020-07-15 LAB — CULTURE, BETA STREP (GROUP B ONLY): Strep Gp B Culture: NEGATIVE

## 2020-07-17 ENCOUNTER — Other Ambulatory Visit: Payer: Self-pay | Admitting: Advanced Practice Midwife

## 2020-07-17 ENCOUNTER — Other Ambulatory Visit: Payer: Self-pay | Admitting: Obstetrics

## 2020-07-17 DIAGNOSIS — O10013 Pre-existing essential hypertension complicating pregnancy, third trimester: Secondary | ICD-10-CM

## 2020-07-17 DIAGNOSIS — O99213 Obesity complicating pregnancy, third trimester: Secondary | ICD-10-CM

## 2020-07-17 DIAGNOSIS — O0990 Supervision of high risk pregnancy, unspecified, unspecified trimester: Secondary | ICD-10-CM

## 2020-07-18 ENCOUNTER — Other Ambulatory Visit (INDEPENDENT_AMBULATORY_CARE_PROVIDER_SITE_OTHER): Payer: Medicaid Other

## 2020-07-18 ENCOUNTER — Other Ambulatory Visit: Payer: Self-pay

## 2020-07-18 ENCOUNTER — Telehealth: Payer: Self-pay

## 2020-07-18 ENCOUNTER — Ambulatory Visit (INDEPENDENT_AMBULATORY_CARE_PROVIDER_SITE_OTHER): Payer: Medicaid Other | Admitting: Obstetrics and Gynecology

## 2020-07-18 VITALS — BP 130/70 | Ht 60.0 in | Wt 342.6 lb

## 2020-07-18 DIAGNOSIS — O09293 Supervision of pregnancy with other poor reproductive or obstetric history, third trimester: Secondary | ICD-10-CM

## 2020-07-18 DIAGNOSIS — O99213 Obesity complicating pregnancy, third trimester: Secondary | ICD-10-CM | POA: Diagnosis not present

## 2020-07-18 DIAGNOSIS — O0993 Supervision of high risk pregnancy, unspecified, third trimester: Secondary | ICD-10-CM

## 2020-07-18 DIAGNOSIS — O10919 Unspecified pre-existing hypertension complicating pregnancy, unspecified trimester: Secondary | ICD-10-CM | POA: Diagnosis not present

## 2020-07-18 DIAGNOSIS — Z3A27 27 weeks gestation of pregnancy: Secondary | ICD-10-CM

## 2020-07-18 DIAGNOSIS — Z3A37 37 weeks gestation of pregnancy: Secondary | ICD-10-CM

## 2020-07-18 DIAGNOSIS — Z3A36 36 weeks gestation of pregnancy: Secondary | ICD-10-CM | POA: Diagnosis not present

## 2020-07-18 DIAGNOSIS — O0990 Supervision of high risk pregnancy, unspecified, unspecified trimester: Secondary | ICD-10-CM

## 2020-07-18 DIAGNOSIS — O0933 Supervision of pregnancy with insufficient antenatal care, third trimester: Secondary | ICD-10-CM

## 2020-07-18 DIAGNOSIS — O10913 Unspecified pre-existing hypertension complicating pregnancy, third trimester: Secondary | ICD-10-CM | POA: Diagnosis not present

## 2020-07-18 DIAGNOSIS — Z87898 Personal history of other specified conditions: Secondary | ICD-10-CM

## 2020-07-18 DIAGNOSIS — Z8759 Personal history of other complications of pregnancy, childbirth and the puerperium: Secondary | ICD-10-CM

## 2020-07-18 LAB — POCT URINALYSIS DIPSTICK OB
Glucose, UA: NEGATIVE
POC,PROTEIN,UA: NEGATIVE

## 2020-07-18 LAB — FETAL NONSTRESS TEST

## 2020-07-18 MED ORDER — ACCU-CHEK GUIDE VI STRP: ORAL_STRIP | 12 refills | Status: AC

## 2020-07-18 MED ORDER — ACCU-CHEK GUIDE W/DEVICE KIT: 1.0000 | PACK | 0 refills | Status: AC

## 2020-07-18 MED ORDER — ACCU-CHEK SOFTCLIX LANCETS MISC: 1.0000 | Freq: Four times a day (QID) | 12 refills | Status: AC

## 2020-07-18 NOTE — Progress Notes (Signed)
Routine Prenatal Care Visit  Subjective  Robin [name] is a 29 y.o. [phone] at [redacted]w[redacted]d being seen today for ongoing prenatal care.  She is currently monitored for the following issues for this high-risk pregnancy and has Obesity affecting pregnancy; History of gestational hypertension; Supervision of high risk pregnancy, antepartum; Chronic hypertension; History of substance use disorder; Anxiety and depression; Chronic hypertension affecting pregnancy; Vaginal discharge in pregnancy in third trimester; and Late prenatal care affecting pregnancy on their problem list.  ----------------------------------------------------------------------------------- Patient reports no complaints.   Contractions: Regular. Vag. Bleeding: None.  Movement: Present. Denies leaking of fluid.  ----------------------------------------------------------------------------------- The following portions of the patient's history were reviewed and updated as appropriate: allergies, current medications, past family history, past medical history, past social history, past surgical history and problem list. Problem list updated.   Objective  Blood pressure 130/70, height 5' (1.524 m), weight (!) 342 lb 9.6 oz (155.4 kg), last menstrual period 11/30/2019. Pregravid weight 260 lb (117.9 kg) Total Weight Gain 82 lb 9.6 oz (37.5 kg) Urinalysis:      Fetal Status:     Movement: Present     General:  Alert, oriented and cooperative. Patient is in no acute distress.  Skin: Skin is warm and dry. No rash noted.   Cardiovascular: Normal heart rate noted  Respiratory: Normal respiratory effort, no problems with respiration noted  Abdomen: Soft, gravid, appropriate for gestational age. Pain/Pressure: Present     Pelvic:  Cervical exam deferred        Extremities: Normal range of motion.     ental Status: Normal mood and affect. Normal behavior. Normal judgment and thought content.   US OB Follow Up  Result Date:  07/18/2020 Patient Name: Robin [name] DOB: 17-Dec-1990 MRN: [id number] ULTRASOUND REPORT Location: [facility] Date of Service: 07/18/2020 Indications:growth/afi Findings: Mason Jim intrauterine pregnancy is visualized with FHR at 143 BPM. Biometrics give an (U/S) Gestational age of [redacted]w[redacted]d and an (U/S) EDD of 08/10/2020; this correlates with the clinically established Estimated Date of Delivery: 08/02/20. Fetal presentation is Cephalic. Placenta: posterior. Grade: 2 AFI: 7.1 cm Growth percentile is 41.8%.  AC percentile is 48.9%. EFW: 3107 g  ( 6 lb 14 oz ) Impression: 1. [redacted]w[redacted]d Viable Singleton Intrauterine pregnancy previously established criteria. 2. Growth is 41.8 %ile.  AFI is 7.1 cm. Recommendations: 1.Clinical correlation with the patient's History and Physical Exam. Deanna Artis, RT There is a singleton gestation with normal amniotic fluid volume. The fetal biometry correlates with established dating.  Limited fetal anatomy was performed. The visualized fetal anatomy appears within normal limits within the resolution of ultrasound as described above.  It must be noted that a normal ultrasound is unable to rule out fetal aneuploidy.  Vena Austria, MD, [facility], [facility] 07/18/2020, 12:06 PM   [place] MFM FETAL BPP WO NON STRESS  Result Date: 07/13/2020 ----------------------------------------------------------------------  OBSTETRICS REPORT                       (Signed Final 07/13/2020 12:42 pm) ---------------------------------------------------------------------- Patient Info  ID #:       [id number]                          D.O.B.:  06/07/1991 (29 yrs)  Name:       Robin [name]               Visit Date: 07/13/2020 11:33 am ---------------------------------------------------------------------- Performed  By  Attending:        Lin Landsman      Referred By:      Tresea Mall                    MD  Performed By:     Loretha Brasil,      [facility]                    RD[facility], RVT                                [facility] at                                                             [facility] ---------------------------------------------------------------------- Orders  #  Description                           Code        Ordered By  1  [place] MFM FETAL BPP WO NON               76819.01    JANE GLEDHILL     STRESS ----------------------------------------------------------------------  #  Order #                     Accession #                Episode #  1  [id number]                   [id number]                 [id number] ---------------------------------------------------------------------- Indications  [redacted] weeks gestation of pregnancy                Z3A.37  Pre-existing essential hypertension            O10.013  complicating pregnancy, third trimester  Obesity complicating pregnancy, third          O99.213  trimester ---------------------------------------------------------------------- Fetal Evaluation  Num Of Fetuses:         1  Fetal Heart Rate(bpm):  133  Cardiac Activity:       Observed  Presentation:           Cephalic  Placenta:               Posterior  AFI Sum(cm)     %Tile       Largest Pocket(cm)  13.8            51          5.2  RUQ(cm)       RLQ(cm)       LUQ(cm)        LLQ(cm)  2.8           5.2           1.6            4.2 ---------------------------------------------------------------------- Biophysical Evaluation  Amniotic F.V:   Within normal limits       F. Tone:  Observed  F. Movement:    Observed                   Score:          8/8  F. Breathing:   Observed ---------------------------------------------------------------------- Gestational Age  LMP:           32w 2d        Date:  11/30/19                 EDD:   09/05/20  Best:          37w 1d     Det. By:  Previous Ultrasound      EDD:   08/02/20                                      (03/24/20) ----------------------------------------------------------------------  Impression  Antenatal testing due to diabetes in pregnancy  Biophysical profile 8/8 with good fetal movement and  amniotic fluid volume.  She reports good feta movement and is performing daily  kickcounts. ---------------------------------------------------------------------- Recommendations  Antenatal testing is scheduled in 1 week. ----------------------------------------------------------------------               Lin Landsman, MD Electronically Signed Final Report   07/13/2020 12:42 pm ----------------------------------------------------------------------  [place] MFM OB FOLLOW UP  Result Date: 06/22/2020 ----------------------------------------------------------------------  OBSTETRICS REPORT                         (Signed Final 06/22/2020 10:43 am) ---------------------------------------------------------------------- Patient Info  ID #:        [id number]                          D.O.B.:  1990/12/03 (29 yrs)  Name:        Robin [name]               Visit Date: 06/22/2020 10:20 am ---------------------------------------------------------------------- Performed By  Attending:         Ma Rings MD         Referred By:       Tresea Mall  Performed By:      Neita Carp RDMS        [facility]                                                               [facility] ---------------------------------------------------------------------- Orders  #   Description                          Code         Ordered By  1   US MFM OB FOLLOW UP                  16109.6076816.01     Tresea MallJANE GLEDHILL ----------------------------------------------------------------------  #   Order #                    Accession #                 Episode #  1   [id number]                  [id number]                  [id number]  ---------------------------------------------------------------------- Indications  Obesity complicating pregnancy, third trimester O99.213  [redacted] weeks gestation of pregnancy                 Z3A.34  Hypertension - Gestational                      O13.9 ---------------------------------------------------------------------- Fetal Evaluation  Num Of Fetuses:          1  Fetal Heart Rate(bpm):   136  Cardiac Activity:        Observed  Presentation:            Cephalic  Placenta:                Posterior  P. Cord Insertion:       Previously Visualized  AFI Sum(cm)     %Tile       Largest Pocket(cm)  10.5            23          4.1  RUQ(cm)       RLQ(cm)        LUQ(cm)        LLQ(cm)  3.5           1.1            4.1            1.8 ---------------------------------------------------------------------- Biometry  BPD:      85.2   mm     G. Age:  34w 2d         54  %    CI:         78.44  %    70 - 86                                                           FL/HC:       21.3  %    19.4 - 21.8  HC:      304.3   mm     G. Age:  33w 6d         11  %    HC/AC:       1.02       0.96 - 1.11  AC:        299   mm  G. Age:  33w 6d         46  %    FL/BPD:      75.9  %    71 - 87  FL:       64.7   mm     G. Age:  33w 3d         22  %    FL/AC:       21.6  %    20 - 24  Est. FW:    2273   gm          5 lb      33  % ---------------------------------------------------------------------- Gestational Age  LMP:            29w 2d       Date:  11/30/19                   EDD:  09/05/20  U/S Today:      33w 6d                                         EDD:  08/04/20  Best:           34w 1d    Det. By:  Previous Ultrasound        EDD:  08/02/20                                      (03/24/20) ---------------------------------------------------------------------- Anatomy  Stomach:                Appears normal, left   Bladder:                Appears normal                          sided  Kidneys:                Appear normal          Spine:                   Appears normal ---------------------------------------------------------------------- Comments  This patient was seen for a follow up growth scan due to  maternal obesity (BMI 62) and history of chronic hypertension  that is not currently treated with any medications.  She reports  that she has missed multiple prenatal  appointments and has not  been screened for gestational diabetes in her current  pregnancy.  She was informed that the fetal growth and amniotic fluid level  appears appropriate for her gestational age.  Due to her increased BMI (>40), we will start weekly fetal  testing at around 36 weeks.  A biophysical profile was scheduled in our office in 2 weeks ----------------------------------------------------------------------                    Ma Rings, MD Electronically Signed Final Report   06/22/2020 10:43 am ----------------------------------------------------------------------  Baseline: 135 Variability: moderate Accelerations: present Decelerations: absent Tocometry: N/A The patient was monitored for 30 minutes, fetal heart rate tracing was deemed reactive, category I tracing,  CPT [id number]   Assessment   29 y.o. [id number] at [redacted]w[redacted]d by  08/02/2020, by Ultrasound presenting for routine prenatal visit  Plan   Pregnancy  2021 Problems (from 02/07/20 to present)    Problem Noted Resolved   Late prenatal care affecting pregnancy 07/12/2020 by Mirna Mires, CNM No   Vaginal discharge in pregnancy in third trimester 07/11/2020 by Mirna Mires, CNM No   Overview Signed 07/11/2020 12:37 PM by Mirna Mires, CNM    Nuswab sent 07/11/2020      Chronic hypertension affecting pregnancy 05/05/2020 by Conard Novak, MD No   Supervision of high risk pregnancy, antepartum 03/16/2020 by Mirna Mires, CNM No   Overview Addendum 07/11/2020 12:35 PM by Mirna Mires, CNM     Clinic  Prenatal Labs ordered 7/15  Dating  ordered 03/16/20 Blood type:   A+  Genetic  Screen 1 Screen:    AFP:     Quad:     NIPS: Antibody: negative  Anatomic [place] Normal female Rubella:  immune  GTT Early:               Third trimester: 11/9 drawn late RPR:   NR  Flu vaccine  HBsAg:   negative  TDaP vaccine                                               Rhogam: HIV:   NR  Baby Food                                               GBS: (For PCN allergy, check sensitivities)  Contraception  ZOX:WRUE  Circumcision    Pediatrician    Support Person              Previous Version       Gestational age appropriate obstetric precautions including but not limited to vaginal bleeding, contractions, leaking of fluid and fetal movement were reviewed in detail with the patient.    1) Elevated 1-hr OGTT - unable to commit to 3-hr glucose based on child care.  Will check BG for next week supplies sent log given  2) Obesity - normal growth scan today, normal AFI, reactive NST  3) HTN - BP remains well controlled.  4) Delivery - discussed recommendation for delivery at tertiary care center given weight and co-morbidities.  Will initiate transfer to faculty practice.  Continue care/APT until transfer confirmed given late gestational age  Return in about 1 week (around 07/25/2020) for ROB, NST, AFI.  Vena Austria, MD, [facility], [facility] 07/18/2020, 11:37 AM

## 2020-07-18 NOTE — Telephone Encounter (Signed)
Patient returned call. Aware of apt date/time location. She will contact [facility] [employer] to verify if they are able to take her to that apt and let us know if she needs further assistance.

## 2020-07-18 NOTE — Telephone Encounter (Signed)
LMVM TRC. 

## 2020-07-20 ENCOUNTER — Other Ambulatory Visit: Payer: Medicaid Other

## 2020-07-20 LAB — GC/CHLAMYDIA PROBE AMP
Chlamydia trachomatis, NAA: NEGATIVE
Neisseria Gonorrhoeae by PCR: NEGATIVE

## 2020-07-24 ENCOUNTER — Ambulatory Visit: Payer: Medicaid Other

## 2020-07-24 ENCOUNTER — Other Ambulatory Visit: Payer: Self-pay | Admitting: Obstetrics and Gynecology

## 2020-07-24 DIAGNOSIS — O10913 Unspecified pre-existing hypertension complicating pregnancy, third trimester: Secondary | ICD-10-CM

## 2020-07-25 ENCOUNTER — Encounter: Payer: Self-pay | Admitting: *Deleted

## 2020-07-25 ENCOUNTER — Other Ambulatory Visit: Payer: Self-pay

## 2020-07-25 ENCOUNTER — Ambulatory Visit: Payer: Medicaid Other | Admitting: *Deleted

## 2020-07-25 ENCOUNTER — Ambulatory Visit (HOSPITAL_BASED_OUTPATIENT_CLINIC_OR_DEPARTMENT_OTHER): Payer: Medicaid Other

## 2020-07-25 DIAGNOSIS — N898 Other specified noninflammatory disorders of vagina: Secondary | ICD-10-CM | POA: Insufficient documentation

## 2020-07-25 DIAGNOSIS — O99213 Obesity complicating pregnancy, third trimester: Secondary | ICD-10-CM | POA: Diagnosis not present

## 2020-07-25 DIAGNOSIS — O10919 Unspecified pre-existing hypertension complicating pregnancy, unspecified trimester: Secondary | ICD-10-CM

## 2020-07-25 DIAGNOSIS — O24410 Gestational diabetes mellitus in pregnancy, diet controlled: Secondary | ICD-10-CM

## 2020-07-25 DIAGNOSIS — O0990 Supervision of high risk pregnancy, unspecified, unspecified trimester: Secondary | ICD-10-CM

## 2020-07-25 DIAGNOSIS — O10013 Pre-existing essential hypertension complicating pregnancy, third trimester: Secondary | ICD-10-CM

## 2020-07-25 DIAGNOSIS — Z3A38 38 weeks gestation of pregnancy: Secondary | ICD-10-CM

## 2020-07-25 DIAGNOSIS — O10913 Unspecified pre-existing hypertension complicating pregnancy, third trimester: Secondary | ICD-10-CM

## 2020-07-25 DIAGNOSIS — O26893 Other specified pregnancy related conditions, third trimester: Secondary | ICD-10-CM | POA: Insufficient documentation

## 2020-07-26 ENCOUNTER — Other Ambulatory Visit: Payer: Self-pay

## 2020-07-26 ENCOUNTER — Inpatient Hospital Stay (HOSPITAL_COMMUNITY)
Admission: AD | Admit: 2020-07-26 | Discharge: 2020-07-29 | DRG: 806 | Disposition: A | Discharge status: Home / Self Care | Payer: Medicaid Other | Attending: Obstetrics & Gynecology | Admitting: Obstetrics & Gynecology

## 2020-07-26 ENCOUNTER — Inpatient Hospital Stay (HOSPITAL_COMMUNITY): Payer: Medicaid Other

## 2020-07-26 ENCOUNTER — Encounter: Payer: Medicaid Other | Admitting: Obstetrics

## 2020-07-26 ENCOUNTER — Encounter (HOSPITAL_COMMUNITY): Payer: Self-pay | Admitting: Obstetrics & Gynecology

## 2020-07-26 ENCOUNTER — Other Ambulatory Visit: Payer: Medicaid Other

## 2020-07-26 DIAGNOSIS — Z975 Presence of (intrauterine) contraceptive device: Secondary | ICD-10-CM

## 2020-07-26 DIAGNOSIS — O99345 Other mental disorders complicating the puerperium: Secondary | ICD-10-CM | POA: Diagnosis not present

## 2020-07-26 DIAGNOSIS — Z30430 Encounter for insertion of intrauterine contraceptive device: Secondary | ICD-10-CM

## 2020-07-26 DIAGNOSIS — O99214 Obesity complicating childbirth: Secondary | ICD-10-CM | POA: Diagnosis present

## 2020-07-26 DIAGNOSIS — F419 Anxiety disorder, unspecified: Secondary | ICD-10-CM | POA: Diagnosis not present

## 2020-07-26 DIAGNOSIS — E669 Obesity, unspecified: Secondary | ICD-10-CM | POA: Diagnosis not present

## 2020-07-26 DIAGNOSIS — I10 Essential (primary) hypertension: Secondary | ICD-10-CM | POA: Diagnosis present

## 2020-07-26 DIAGNOSIS — F1290 Cannabis use, unspecified, uncomplicated: Secondary | ICD-10-CM | POA: Diagnosis present

## 2020-07-26 DIAGNOSIS — O99334 Smoking (tobacco) complicating childbirth: Secondary | ICD-10-CM | POA: Diagnosis present

## 2020-07-26 DIAGNOSIS — R7303 Prediabetes: Secondary | ICD-10-CM | POA: Diagnosis present

## 2020-07-26 DIAGNOSIS — R030 Elevated blood-pressure reading, without diagnosis of hypertension: Secondary | ICD-10-CM | POA: Diagnosis present

## 2020-07-26 DIAGNOSIS — O99324 Drug use complicating childbirth: Secondary | ICD-10-CM | POA: Diagnosis present

## 2020-07-26 DIAGNOSIS — Z20822 Contact with and (suspected) exposure to covid-19: Secondary | ICD-10-CM | POA: Diagnosis present

## 2020-07-26 DIAGNOSIS — Z87898 Personal history of other specified conditions: Secondary | ICD-10-CM

## 2020-07-26 DIAGNOSIS — O99815 Abnormal glucose complicating the puerperium: Secondary | ICD-10-CM | POA: Diagnosis not present

## 2020-07-26 DIAGNOSIS — O99210 Obesity complicating pregnancy, unspecified trimester: Secondary | ICD-10-CM | POA: Diagnosis present

## 2020-07-26 DIAGNOSIS — Z8759 Personal history of other complications of pregnancy, childbirth and the puerperium: Secondary | ICD-10-CM

## 2020-07-26 DIAGNOSIS — O99892 Other specified diseases and conditions complicating childbirth: Principal | ICD-10-CM | POA: Diagnosis present

## 2020-07-26 DIAGNOSIS — F17210 Nicotine dependence, cigarettes, uncomplicated: Secondary | ICD-10-CM | POA: Diagnosis present

## 2020-07-26 DIAGNOSIS — O0930 Supervision of pregnancy with insufficient antenatal care, unspecified trimester: Secondary | ICD-10-CM

## 2020-07-26 DIAGNOSIS — O0990 Supervision of high risk pregnancy, unspecified, unspecified trimester: Secondary | ICD-10-CM

## 2020-07-26 DIAGNOSIS — O99893 Other specified diseases and conditions complicating puerperium: Secondary | ICD-10-CM | POA: Diagnosis not present

## 2020-07-26 DIAGNOSIS — N898 Other specified noninflammatory disorders of vagina: Secondary | ICD-10-CM

## 2020-07-26 DIAGNOSIS — F329 Major depressive disorder, single episode, unspecified: Secondary | ICD-10-CM | POA: Diagnosis not present

## 2020-07-26 DIAGNOSIS — Z3A39 39 weeks gestation of pregnancy: Secondary | ICD-10-CM

## 2020-07-26 DIAGNOSIS — O99215 Obesity complicating the puerperium: Secondary | ICD-10-CM | POA: Diagnosis not present

## 2020-07-26 DIAGNOSIS — O10919 Unspecified pre-existing hypertension complicating pregnancy, unspecified trimester: Secondary | ICD-10-CM | POA: Diagnosis present

## 2020-07-26 DIAGNOSIS — O99325 Drug use complicating the puerperium: Secondary | ICD-10-CM | POA: Diagnosis not present

## 2020-07-26 DIAGNOSIS — F32A Depression, unspecified: Secondary | ICD-10-CM | POA: Diagnosis present

## 2020-07-26 LAB — COMPREHENSIVE METABOLIC PANEL
ALT: 15 U/L (ref 0–44)
AST: 15 U/L (ref 15–41)
Albumin: 2.8 g/dL — ABNORMAL LOW (ref 3.5–5.0)
Alkaline Phosphatase: 110 U/L (ref 38–126)
Anion gap: 13 (ref 5–15)
BUN: 8 mg/dL (ref 6–20)
CO2: 19 mmol/L — ABNORMAL LOW (ref 22–32)
Calcium: 9.2 mg/dL (ref 8.9–10.3)
Chloride: 105 mmol/L (ref 98–111)
Creatinine, Ser: 0.67 mg/dL (ref 0.44–1.00)
GFR, Estimated: >60 mL/min (ref >60)
Glucose, Bld: 107 mg/dL — ABNORMAL HIGH (ref 70–99)
Potassium: 3.4 mmol/L — ABNORMAL LOW (ref 3.5–5.1)
Sodium: 137 mmol/L (ref 135–145)
Total Bilirubin: 0.3 mg/dL (ref 0.3–1.2)
Total Protein: 6.6 g/dL (ref 6.5–8.1)

## 2020-07-26 LAB — TYPE AND SCREEN
ABO/RH(D): A POS
Antibody Screen: NEGATIVE

## 2020-07-26 LAB — PROTEIN / CREATININE RATIO, URINE
Creatinine, Urine: 260.59 mg/dL
Protein Creatinine Ratio: 0.15 mg/mg{Cre} (ref 0.00–0.15)
Total Protein, Urine: 38 mg/dL

## 2020-07-26 LAB — CBC
HCT: 38.6 % (ref 36.0–46.0)
Hemoglobin: 12.2 g/dL (ref 12.0–15.0)
MCH: 27.5 pg (ref 26.0–34.0)
MCHC: 31.6 g/dL (ref 30.0–36.0)
MCV: 86.9 fL (ref 80.0–100.0)
Platelets: 337 10*3/uL (ref 150–400)
RBC: 4.44 MIL/uL (ref 3.87–5.11)
RDW: 13.2 % (ref 11.5–15.5)
WBC: 10.7 10*3/uL — ABNORMAL HIGH (ref 4.0–10.5)
nRBC: 0 % (ref 0.0–0.2)

## 2020-07-26 LAB — GLUCOSE, CAPILLARY
Glucose-Capillary: 83 mg/dL (ref 70–99)
Glucose-Capillary: 94 mg/dL (ref 70–99)

## 2020-07-26 LAB — RESP PANEL BY RT-PCR (FLU A&B, COVID) ARPGX2
Influenza A by PCR: NEGATIVE
Influenza B by PCR: NEGATIVE
SARS Coronavirus 2 by RT PCR: NEGATIVE

## 2020-07-26 MED ORDER — SOD CITRATE-CITRIC ACID 500-334 MG/5ML PO SOLN: 30.0000 mL | ORAL | Status: DC | PRN

## 2020-07-26 MED ORDER — LACTATED RINGERS IV SOLN: INTRAVENOUS | Status: DC

## 2020-07-26 MED ORDER — LEVONORGESTREL 19.5 MCG/DAY IU IUD
INTRAUTERINE_SYSTEM | Freq: Once | INTRAUTERINE | Status: AC
  Administered 2020-07-27: 1 via INTRAUTERINE
  Filled 2020-07-26: qty 1

## 2020-07-26 MED ORDER — OXYCODONE-ACETAMINOPHEN 5-325 MG PO TABS
1.0000 | ORAL_TABLET | ORAL | Status: DC | PRN
  Administered 2020-07-28 (×2): 1 via ORAL
  Filled 2020-07-26 (×2): qty 1

## 2020-07-26 MED ORDER — ONDANSETRON HCL 4 MG/2ML IJ SOLN: 4.0000 mg | Freq: Four times a day (QID) | INTRAMUSCULAR | Status: DC | PRN

## 2020-07-26 MED ORDER — OXYTOCIN BOLUS FROM INFUSION
333.0000 mL | Freq: Once | INTRAVENOUS | Status: AC
  Administered 2020-07-27: 333 mL via INTRAVENOUS

## 2020-07-26 MED ORDER — MISOPROSTOL 50MCG HALF TABLET
50.0000 ug | ORAL_TABLET | ORAL | Status: DC | PRN
  Administered 2020-07-26 – 2020-07-27 (×3): 50 ug via ORAL
  Filled 2020-07-26 (×4): qty 1

## 2020-07-26 MED ORDER — LACTATED RINGERS IV SOLN: 500.0000 mL | INTRAVENOUS | Status: DC | PRN

## 2020-07-26 MED ORDER — LIDOCAINE HCL (PF) 1 % IJ SOLN
30.0000 mL | INTRAMUSCULAR | Status: DC | PRN
  Filled 2020-07-26: qty 30

## 2020-07-26 MED ORDER — OXYTOCIN-SODIUM CHLORIDE 30-0.9 UT/500ML-% IV SOLN
1.0000 m[IU]/min | INTRAVENOUS | Status: DC
  Administered 2020-07-27: 2 m[IU]/min via INTRAVENOUS
  Filled 2020-07-26: qty 500

## 2020-07-26 MED ORDER — ACETAMINOPHEN 325 MG PO TABS: 650.0000 mg | ORAL_TABLET | ORAL | Status: DC | PRN

## 2020-07-26 MED ORDER — OXYCODONE-ACETAMINOPHEN 5-325 MG PO TABS
2.0000 | ORAL_TABLET | ORAL | Status: DC | PRN
  Administered 2020-07-29: 2 via ORAL
  Filled 2020-07-26: qty 2

## 2020-07-26 MED ORDER — TERBUTALINE SULFATE 1 MG/ML IJ SOLN
0.2500 mg | Freq: Once | INTRAMUSCULAR | Status: DC | PRN
  Filled 2020-07-26: qty 1

## 2020-07-26 MED ORDER — OXYTOCIN-SODIUM CHLORIDE 30-0.9 UT/500ML-% IV SOLN: 2.5000 [IU]/h | INTRAVENOUS | Status: DC

## 2020-07-26 MED ORDER — FENTANYL CITRATE (PF) 100 MCG/2ML IJ SOLN
50.0000 ug | INTRAMUSCULAR | Status: DC | PRN
  Administered 2020-07-26 – 2020-07-27 (×8): 100 ug via INTRAVENOUS
  Filled 2020-07-26 (×8): qty 2

## 2020-07-26 NOTE — Progress Notes (Signed)
Robin [name] is a 29 y.o. [id number] at [redacted]w[redacted]d.  Subjective: Mild cramping  Objective: BP (!) 117/52   Pulse 87   Temp 98.1 F (36.7 C) (Oral)   Resp 18   Ht 5' (1.524 m)   Wt (!) 153.6 kg   LMP 11/30/2019   BMI 66.13 kg/m    FHT:  FHR: 130 bpm, variability: mod,  accelerations:  15x15,  decelerations:  none UC:   Irreg, mild Dilation: 1 Effacement (%): Thick Cervical Position: Posterior Station: Ballotable Presentation: Vertex Exam by:: Dorathy Kinsman CNM Foley bulb placed w/out difficulty.  Labs: Results for orders placed or performed during the hospital encounter of 07/26/20 (from the past 24 hour(s))  CBC     Status: Abnormal   Collection Time: 07/26/20  3:30 PM  Result Value Ref Range   WBC 10.7 (H) 4.0 - 10.5 K/uL   RBC 4.44 3.87 - 5.11 MIL/uL   Hemoglobin 12.2 12.0 - 15.0 g/dL   HCT 38.8 36 - 46 %   MCV 86.9 80.0 - 100.0 fL   MCH 27.5 26.0 - 34.0 pg   MCHC 31.6 30.0 - 36.0 g/dL   RDW 82.8 00.3 - 49.1 %   Platelets 337 150 - 400 K/uL   nRBC 0.0 0.0 - 0.2 %  Type and screen [facility]     Status: None   Collection Time: 07/26/20  3:30 PM  Result Value Ref Range   ABO/RH(D) A POS    Antibody Screen NEG    Sample Expiration      07/29/2020,2359 Performed at [facility], [address], [place], [place] [address]   Comprehensive metabolic panel     Status: Abnormal   Collection Time: 07/26/20  3:30 PM  Result Value Ref Range   Sodium 137 135 - 145 mmol/L   Potassium 3.4 (L) 3.5 - 5.1 mmol/L   Chloride 105 98 - 111 mmol/L   CO2 19 (L) 22 - 32 mmol/L   Glucose, Bld 107 (H) 70 - 99 mg/dL   BUN 8 6 - 20 mg/dL   Creatinine, Ser 5.69 0.44 - 1.00 mg/dL   Calcium 9.2 8.9 - 79.4 mg/dL   Total Protein 6.6 6.5 - 8.1 g/dL   Albumin 2.8 (L) 3.5 - 5.0 g/dL   AST 15 15 - 41 U/L   ALT 15 0 - 44 U/L   Alkaline Phosphatase 110 38 - 126 U/L   Total Bilirubin 0.3 0.3 - 1.2 mg/dL   GFR, Estimated >80 >16 mL/min   Anion gap 13 5 - 15  Protein  / creatinine ratio, urine     Status: None   Collection Time: 07/26/20  3:30 PM  Result Value Ref Range   Creatinine, Urine 260.59 mg/dL   Total Protein, Urine 38 mg/dL   Protein Creatinine Ratio 0.15 0.00 - 0.15 mg/mg[Cre]  Resp Panel by RT-PCR (Flu A&B, Covid) Nasopharyngeal Swab     Status: None   Collection Time: 07/26/20  3:33 PM   Specimen: Nasopharyngeal Swab; Nasopharyngeal(NP) swabs in vial transport medium  Result Value Ref Range   SARS Coronavirus 2 by RT PCR NEGATIVE NEGATIVE   Influenza A by PCR NEGATIVE NEGATIVE   Influenza B by PCR NEGATIVE NEGATIVE  Glucose, capillary     Status: None   Collection Time: 07/26/20  4:26 PM  Result Value Ref Range   Glucose-Capillary 83 70 - 99 mg/dL  Glucose, capillary     Status: None   Collection  Time: 07/26/20  9:06 PM  Result Value Ref Range   Glucose-Capillary 94 70 - 99 mg/dL    Assessment / Plan: [redacted]w[redacted]d week IUP Labor: Early/IOL Fetal Wellbeing:  Category I Pain Control:  Fentanyl Anticipated MOD:  SVD Elevated 1 hour GTT/Pre-Diabetes w/ Nml CBGs in labor. Baby 6-14 (41%tile) on growth [place] 11/16.   Katrinka Blazing, [place], CNM 07/26/2020 11:00 PM

## 2020-07-26 NOTE — H&P (Addendum)
OBSTETRIC ADMISSION HISTORY AND PHYSICAL  Robin [name] is a 29 y.o. female [phone] with IUP at [redacted]w[redacted]d by [place] presenting for sent by induction of labor by [facility] for elevated blood pressures without diagnosis for gestational hypertension or chronic hypertension, BMI 62, and prediabetes (A1c of 6, failed the 1 hour GTT and did not have the 3hr GTT). Also has history of pre-e in prior pregnancies. She reports +FMs, No LOF, no VB, no blurry vision, headaches or peripheral edema, and RUQ pain.  She plans on breast feeding. She request PP IUD for birth control. She received her prenatal care at [facility]: By [place] --->  Estimated Date of Delivery: 08/02/20  Sono:  07/18/2020  [redacted]w[redacted]d, CWD, normal anatomy, cephalic presentation, stable lie, 3107g, 48.9% EFW   Past Medical History: Past Medical History:  Diagnosis Date  . Anemia    4th pregnancy  . Asthma    No Inhaler  . Depression    Hx of Depression, refused medication.  Marland Kitchen History of urinary tract infection   . Preeclampsia   . Pregnancy induced hypertension    With all previous pregnancies  . Spontaneous abortion 06/2015    Past Surgical History: Past Surgical History:  Procedure Laterality Date  . NO PAST SURGERIES      Obstetrical History: OB History    Gravida  5   Para  3   Term  2   Preterm  1   AB  1   Living  3     SAB  1   TAB  0   Ectopic  0   Multiple  0   Live Births  3           Social History Social History   Socioeconomic History  . Marital status: Significant Other    Spouse name: Not on file  . Number of children: 3  . Years of education: 58  . Highest education level: Not on file  Occupational History  . Not on file  Tobacco Use  . Smoking status: Current Every Day Smoker    Packs/day: 0.50    Years: 12.00    Pack years: 6.00    Types: Cigarettes  . Smokeless tobacco: Never Used  Vaping Use  . Vaping Use: Never used  Substance and Sexual Activity  .  Alcohol use: Not Currently    Comment: occasionally not since pregnancy  . Drug use: Yes    Types: Marijuana  . Sexual activity: Yes    Birth control/protection: None    Comment: Last BCM was Depo ~ 2 years ago.  Other Topics Concern  . Not on file  Social History Narrative  . Not on file   Social Determinants of Health   Financial Resource Strain:   . Difficulty of Paying Living Expenses: Not on file  Food Insecurity:   . Worried About Charity fundraiser in the Last Year: Not on file  . Ran Out of Food in the Last Year: Not on file  Transportation Needs:   . Lack of Transportation (Medical): Not on file  . Lack of Transportation (Non-Medical): Not on file  Physical Activity:   . Days of Exercise per Week: Not on file  . Minutes of Exercise per Session: Not on file  Stress:   . Feeling of Stress : Not on file  Social Connections:   . Frequency of Communication with Friends and Family: Not on file  . Frequency of Social Gatherings  with Friends and Family: Not on file  . Attends Religious Services: Not on file  . Active Member of Clubs or Organizations: Not on file  . Attends Archivist Meetings: Not on file  . Marital Status: Not on file    Family History: Family History  Problem Relation Age of Onset  . Hypertension Mother   . Heart disease Father   . Cancer Maternal Aunt   . Cancer Paternal Grandmother   . Diabetes Maternal Grandmother     Allergies: No Known Allergies  Medications Prior to Admission  Medication Sig Dispense Refill Last Dose  . prenatal vitamin w/FE, FA (NATACHEW) 29-1 MG CHEW chewable tablet Chew 1 tablet by mouth daily at 12 noon. 30 tablet 8 07/25/2020 at Unknown time  . Accu-Chek Softclix Lancets lancets 1 each by Other route 4 (four) times daily. Use as instructed 100 each 12   . acetaminophen (TYLENOL) 325 MG tablet Take 650 mg by mouth every 6 (six) hours as needed for mild pain or headache. Reported on 03/19/2016     . aspirin  EC 81 MG tablet Take 1 tablet (81 mg total) by mouth daily. Take after 12 weeks for prevention of preeclampssia later in pregnancy (Patient not taking: Reported on 07/25/2020) 300 tablet 2   . Blood Glucose Monitoring Suppl (ACCU-CHEK GUIDE) w/Device KIT 1 kit by Does not apply route as directed. 1 kit 0   . folic acid (FOLVITE) 1 MG tablet Take 1 tablet (1 mg total) by mouth daily. 30 tablet 10   . glucose blood (ACCU-CHEK GUIDE) test strip Use as instructed 100 each 12   . ondansetron (ZOFRAN ODT) 4 MG disintegrating tablet Take 1 tablet (4 mg total) by mouth every 8 (eight) hours as needed for nausea. 20 tablet 1      Review of Systems   All systems reviewed and negative except as stated in HPI  Blood pressure 131/85, pulse (!) 102, temperature 99.2 F (37.3 C), temperature source Oral, resp. rate 16, height 5' (1.524 m), weight (!) 153.6 kg, last menstrual period 11/30/2019. General appearance: alert, cooperative, appears stated age and no distress Lungs: clear to auscultation bilaterally Heart: regular rate and rhythm Abdomen: soft, non-tender; bowel sounds normal Pelvic: adequate for labor Extremities: Homans sign is negative, no sign of DVT  Presentation: cephalic Fetal monitoringBaseline: 140 bpm Uterine activityNone Dilation: Fingertip Effacement (%): 50 Station: -3 Exam by:: Bluford Main, CNM   Prenatal labs: ABO, Rh: --/--/A POS (11/24 1530) Antibody: NEG (11/24 1530) Rubella: 5.73 (07/15 1526) RPR: Non Reactive (11/09 1107)  HBsAg: Negative (07/15 1526)  HIV: Non Reactive (11/09 1107)  GBS: Negative/-- (11/09 1625)  1 hr Glucola 146  Prenatal Transfer Tool  Maternal Diabetes: No Prediabetes Genetic Screening: Declined  Maternal Ultrasounds/Referrals: Normal Fetal Ultrasounds or other Referrals:  None Maternal Substance Abuse:  No Significant Maternal Medications:  None Significant Maternal Lab Results: Group B Strep negative  Results for orders placed or  performed during the hospital encounter of 07/26/20 (from the past 24 hour(s))  CBC   Collection Time: 07/26/20  3:30 PM  Result Value Ref Range   WBC 10.7 (H) 4.0 - 10.5 K/uL   RBC 4.44 3.87 - 5.11 MIL/uL   Hemoglobin 12.2 12.0 - 15.0 g/dL   HCT 38.6 36 - 46 %   MCV 86.9 80.0 - 100.0 fL   MCH 27.5 26.0 - 34.0 pg   MCHC 31.6 30.0 - 36.0 g/dL   RDW 13.2 11.5 -  15.5 %   Platelets 337 150 - 400 K/uL   nRBC 0.0 0.0 - 0.2 %  Comprehensive metabolic panel   Collection Time: 07/26/20  3:30 PM  Result Value Ref Range   Sodium 137 135 - 145 mmol/L   Potassium 3.4 (L) 3.5 - 5.1 mmol/L   Chloride 105 98 - 111 mmol/L   CO2 19 (L) 22 - 32 mmol/L   Glucose, Bld 107 (H) 70 - 99 mg/dL   BUN 8 6 - 20 mg/dL   Creatinine, Ser 0.67 0.44 - 1.00 mg/dL   Calcium 9.2 8.9 - 10.3 mg/dL   Total Protein 6.6 6.5 - 8.1 g/dL   Albumin 2.8 (L) 3.5 - 5.0 g/dL   AST 15 15 - 41 U/L   ALT 15 0 - 44 U/L   Alkaline Phosphatase 110 38 - 126 U/L   Total Bilirubin 0.3 0.3 - 1.2 mg/dL   GFR, Estimated >60 >60 mL/min   Anion gap 13 5 - 15  Protein / creatinine ratio, urine   Collection Time: 07/26/20  3:30 PM  Result Value Ref Range   Creatinine, Urine 260.59 mg/dL   Total Protein, Urine 38 mg/dL   Protein Creatinine Ratio 0.15 0.00 - 0.15 mg/mg[Cre]  Type and screen [place]   Collection Time: 07/26/20  3:30 PM  Result Value Ref Range   ABO/RH(D) A POS    Antibody Screen NEG    Sample Expiration      07/29/2020,2359 Performed at [facility], [place] [address] [place], [place] [address]   Glucose, capillary   Collection Time: 07/26/20  4:26 PM  Result Value Ref Range   Glucose-Capillary 83 70 - 99 mg/dL    Patient Active Problem List   Diagnosis Date Noted  . Indication for care in labor and delivery, antepartum 07/26/2020  . Late prenatal care affecting pregnancy 07/12/2020  . Vaginal discharge in pregnancy in third trimester 07/11/2020  . Chronic hypertension affecting  pregnancy 05/05/2020  . Supervision of high risk pregnancy, antepartum 03/16/2020  . History of substance use disorder 04/29/2018  . Anxiety and depression 04/29/2018  . Chronic hypertension 03/25/2018  . History of gestational hypertension 12/10/2017  . Obesity affecting pregnancy 11/11/2017    Assessment/Plan:  Briele Lagasse is a 29 y.o. [id number] at 89w0dhere for induction of labor sent by WCusterfor elevated blood pressures without diagnosis for gestational hypertension or chronic hypertension, BMI 62, and prediabetes (A1c of 6, failed the 1 hour GTT and did not have the 3hr GTT). Also has history of pre-e in prior pregnancies  #Labor: IOL - cytotec, pitocin #Pain: Desires Epidural #FWB: CAT I #ID: GBS NEG #MOF: Breast #MOC: PP IUD - Liletta #Circ:  yes  JValli Glance Student-MidWife  07/26/2020, 4:35 PM  Attestation of Supervision of Student:  I confirm that I have verified the information documented in the nurse midwife student's note and that I have also personally performed the history, physical exam and all medical decision making activities.  I have verified that all services and findings are accurately documented in this student's note; and I agree with management and plan as outlined in the documentation. I have also made any necessary editorial changes.  Vertex presentation confirmed by bedside U/S performed by Dr. ACorliss Blacker  JGabriel Carina CHindsfor [employer] 07/28/2020 11:51 AM

## 2020-07-27 ENCOUNTER — Inpatient Hospital Stay (HOSPITAL_COMMUNITY): Payer: Medicaid Other | Admitting: Anesthesiology

## 2020-07-27 ENCOUNTER — Encounter (HOSPITAL_COMMUNITY): Payer: Self-pay | Admitting: Obstetrics & Gynecology

## 2020-07-27 DIAGNOSIS — Z975 Presence of (intrauterine) contraceptive device: Secondary | ICD-10-CM

## 2020-07-27 DIAGNOSIS — Z3A39 39 weeks gestation of pregnancy: Secondary | ICD-10-CM

## 2020-07-27 DIAGNOSIS — Z30430 Encounter for insertion of intrauterine contraceptive device: Secondary | ICD-10-CM

## 2020-07-27 DIAGNOSIS — O1490 Unspecified pre-eclampsia, unspecified trimester: Secondary | ICD-10-CM

## 2020-07-27 HISTORY — DX: Presence of (intrauterine) contraceptive device: Z97.5

## 2020-07-27 LAB — GLUCOSE, CAPILLARY
Glucose-Capillary: 101 mg/dL — ABNORMAL HIGH (ref 70–99)
Glucose-Capillary: 120 mg/dL — ABNORMAL HIGH (ref 70–99)
Glucose-Capillary: 92 mg/dL (ref 70–99)
Glucose-Capillary: 76 mg/dL (ref 70–99)
Glucose-Capillary: 94 mg/dL (ref 70–99)

## 2020-07-27 LAB — RPR: RPR Ser Ql: NONREACTIVE

## 2020-07-27 MED ORDER — PHENYLEPHRINE 40 MCG/ML (10ML) SYRINGE FOR IV PUSH (FOR BLOOD PRESSURE SUPPORT)
80.0000 ug | PREFILLED_SYRINGE | INTRAVENOUS | Status: DC | PRN
  Filled 2020-07-27: qty 10

## 2020-07-27 MED ORDER — IBUPROFEN 600 MG PO TABS
600.0000 mg | ORAL_TABLET | Freq: Four times a day (QID) | ORAL | Status: DC
  Administered 2020-07-27 – 2020-07-29 (×6): 600 mg via ORAL
  Filled 2020-07-27 (×6): qty 1

## 2020-07-27 MED ORDER — EPHEDRINE 5 MG/ML INJ
10.0000 mg | INTRAVENOUS | Status: DC | PRN
  Filled 2020-07-27: qty 2

## 2020-07-27 MED ORDER — FENTANYL-BUPIVACAINE-NACL 0.5-0.125-0.9 MG/250ML-% EP SOLN
12.0000 mL/h | EPIDURAL | Status: DC | PRN
  Filled 2020-07-27: qty 250

## 2020-07-27 MED ORDER — TETANUS-DIPHTH-ACELL PERTUSSIS 5-2.5-18.5 LF-MCG/0.5 IM SUSY: 0.5000 mL | PREFILLED_SYRINGE | Freq: Once | INTRAMUSCULAR | Status: DC

## 2020-07-27 MED ORDER — ACETAMINOPHEN 325 MG PO TABS
650.0000 mg | ORAL_TABLET | ORAL | Status: DC | PRN
  Administered 2020-07-28 – 2020-07-29 (×2): 650 mg via ORAL
  Filled 2020-07-27 (×2): qty 2

## 2020-07-27 MED ORDER — SENNOSIDES-DOCUSATE SODIUM 8.6-50 MG PO TABS
2.0000 | ORAL_TABLET | ORAL | Status: DC
  Administered 2020-07-27 – 2020-07-29 (×2): 2 via ORAL
  Filled 2020-07-27 (×2): qty 2

## 2020-07-27 MED ORDER — DIPHENHYDRAMINE HCL 25 MG PO CAPS: 25.0000 mg | ORAL_CAPSULE | Freq: Four times a day (QID) | ORAL | Status: DC | PRN

## 2020-07-27 MED ORDER — PRENATAL MULTIVITAMIN CH
1.0000 | ORAL_TABLET | Freq: Every day | ORAL | Status: DC
  Administered 2020-07-28: 1 via ORAL
  Filled 2020-07-27: qty 1

## 2020-07-27 MED ORDER — COCONUT OIL OIL: 1.0000 "application " | TOPICAL_OIL | Status: DC | PRN

## 2020-07-27 MED ORDER — DIBUCAINE (PERIANAL) 1 % EX OINT: 1.0000 "application " | TOPICAL_OINTMENT | CUTANEOUS | Status: DC | PRN

## 2020-07-27 MED ORDER — SIMETHICONE 80 MG PO CHEW: 80.0000 mg | CHEWABLE_TABLET | ORAL | Status: DC | PRN

## 2020-07-27 MED ORDER — ONDANSETRON HCL 4 MG/2ML IJ SOLN: 4.0000 mg | INTRAMUSCULAR | Status: DC | PRN

## 2020-07-27 MED ORDER — MEASLES, MUMPS & RUBELLA VAC IJ SOLR: 0.5000 mL | Freq: Once | INTRAMUSCULAR | Status: DC

## 2020-07-27 MED ORDER — DIPHENHYDRAMINE HCL 50 MG/ML IJ SOLN: 12.5000 mg | INTRAMUSCULAR | Status: DC | PRN

## 2020-07-27 MED ORDER — LACTATED RINGERS IV SOLN
500.0000 mL | Freq: Once | INTRAVENOUS | Status: AC
  Administered 2020-07-27: 500 mL via INTRAVENOUS

## 2020-07-27 MED ORDER — WITCH HAZEL-GLYCERIN EX PADS: 1.0000 "application " | MEDICATED_PAD | CUTANEOUS | Status: DC | PRN

## 2020-07-27 MED ORDER — PHENYLEPHRINE 40 MCG/ML (10ML) SYRINGE FOR IV PUSH (FOR BLOOD PRESSURE SUPPORT)
80.0000 ug | PREFILLED_SYRINGE | INTRAVENOUS | Status: DC | PRN
  Filled 2020-07-27 (×2): qty 10

## 2020-07-27 MED ORDER — LIDOCAINE HCL (PF) 1 % IJ SOLN
INTRAMUSCULAR | Status: DC | PRN
  Administered 2020-07-27: 5 mL via EPIDURAL

## 2020-07-27 MED ORDER — BENZOCAINE-MENTHOL 20-0.5 % EX AERO: 1.0000 "application " | INHALATION_SPRAY | CUTANEOUS | Status: DC | PRN

## 2020-07-27 MED ORDER — ONDANSETRON HCL 4 MG PO TABS: 4.0000 mg | ORAL_TABLET | ORAL | Status: DC | PRN

## 2020-07-27 MED ORDER — SODIUM CHLORIDE (PF) 0.9 % IJ SOLN
INTRAMUSCULAR | Status: DC | PRN
  Administered 2020-07-27: 12 mL/h via EPIDURAL

## 2020-07-27 NOTE — Anesthesia Procedure Notes (Signed)
Epidural Patient location during procedure: OB Start time: 07/27/2020 8:24 AM End time: 07/27/2020 8:42 AM  Staffing Anesthesiologist: Trevor Iha, MD Performed: anesthesiologist   Preanesthetic Checklist Completed: patient identified, IV checked, site marked, risks and benefits discussed, surgical consent, monitors and equipment checked, pre-op evaluation and timeout performed  Epidural Patient position: sitting Prep: DuraPrep and site prepped and draped Patient monitoring: continuous pulse ox and blood pressure Approach: midline Location: L3-L4 Injection technique: LOR air  Needle:  Needle type: Tuohy  Needle gauge: 17 G Needle length: 9 cm and 9 Needle insertion depth: 9 cm Catheter type: closed end flexible Catheter size: 19 Gauge Catheter at skin depth: 15 cm Test dose: negative  Assessment Events: blood not aspirated, injection not painful, no injection resistance, no paresthesia and negative IV test  Additional Notes Patient identified. Risks/Benefits/Options discussed with patient including but not limited to bleeding, infection, nerve damage, paralysis, failed block, incomplete pain control, headache, blood pressure changes, nausea, vomiting, reactions to medication both or allergic, itching and postpartum back pain. Confirmed with bedside nurse the patient's most recent platelet count. Confirmed with patient that they are not currently taking any anticoagulation, have any bleeding history or any family history of bleeding disorders. Patient expressed understanding and wished to proceed. All questions were answered. Sterile technique was used throughout the entire procedure. Please see nursing notes for vital signs. Test dose was given through epidural needle and negative prior to continuing to dose epidural or start infusion. Warning signs of high block given to the patient including shortness of breath, tingling/numbness in hands, complete motor block, or any  concerning symptoms with instructions to call for help. Patient was given instructions on fall risk and not to get out of bed. All questions and concerns addressed with instructions to call with any issues. 1 Attempt (S) . Patient tolerated procedure well.

## 2020-07-27 NOTE — Progress Notes (Signed)
Labor Progress Note Robin [name] is a 29 y.o. [id number] at [redacted]w[redacted]d presented for IOL-elevated BP, pre-diabetes, BMI 62.  S: Doing well, currently getting an epidural  O:  BP (!) 105/52   Pulse 81   Temp 98.1 F (36.7 C) (Oral)   Resp 20   Ht 5' (1.524 m)   Wt (!) 153.6 kg   LMP 11/30/2019   SpO2 100%   BMI 66.13 kg/m  EFM: baseline 130bpm/mod variability/+ accels/no decels Toco: q1-4 min  CVE: Dilation: 3.5 Effacement (%): 50 Cervical Position: Posterior Station: -3 Presentation: Vertex Exam by:: MScott, RN   A&P: 29 y.o. [id number] [redacted]w[redacted]d presented for IOL-elevated BP, pre-diabetes, BMI 62. #IOL: S/p cytotec x3 and FB (out ~0530). Pit started @0545 . Continue to titrate. #Pain: epidural #FWB: cat 1 #GBS negative #elevated BP without diagnosis: patient does not meet diagnostic criteria for gHTN or cHTN after review of prenatal records. Not on meds. PreE labs nml on admit. Asymptomatic. BP intermittently elevated since admission, no severe range pressures. Continue to monitor. #pre-diabetes: a1c 6.0, failed 1hr gtt, no 3hr gtt. Not on meds. q4 BGL checks during latent labor and q2 during active labor. 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 SW consult pp #Contraception: desires post placental liletta, ordered. Will sign consents after epidural placement.  , MD 8:35 AM

## 2020-07-27 NOTE — Anesthesia Preprocedure Evaluation (Signed)
Anesthesia Evaluation  Patient identified by MRN, date of birth, ID band Patient awake    Reviewed: Allergy & Precautions, NPO status , Patient's Chart, lab work & pertinent test results  Airway Mallampati: IV  TM Distance: >3 FB Neck ROM: Full    Dental no notable dental hx. (+) Teeth Intact, Dental Advisory Given   Pulmonary asthma , Current Smoker,    Pulmonary exam normal breath sounds clear to auscultation       Cardiovascular hypertension, Pt. on medications Normal cardiovascular exam Rhythm:Regular Rate:Normal  Pre E   Neuro/Psych    GI/Hepatic negative GI ROS, Neg liver ROS,   Endo/Other  diabetes, GestationalMorbid obesity  Renal/GU      Musculoskeletal negative musculoskeletal ROS (+)   Abdominal   Peds  Hematology Hgb 12.2 Plt 330   Anesthesia Other Findings   Reproductive/Obstetrics (+) Pregnancy                             Anesthesia Physical Anesthesia Plan  ASA: IV  Anesthesia Plan: Epidural   Post-op Pain Management:    Induction:   PONV Risk Score and Plan: Treatment may vary due to age or medical condition and Propofol infusion  Airway Management Planned:   Additional Equipment:   Intra-op Plan:   Post-operative Plan:   Informed Consent: I have reviewed the patients History and Physical, chart, labs and discussed the procedure including the risks, benefits and alternatives for the proposed anesthesia with the patient or authorized representative who has indicated his/her understanding and acceptance.       Plan Discussed with:   Anesthesia Plan Comments: (39 wk G5P3 w GDM, Pre E, BMI for LEA)        Anesthesia Quick Evaluation

## 2020-07-27 NOTE — Discharge Summary (Signed)
Postpartum Discharge Summary     Patient Name: Robin [name] DOB: 02/05/1991 MRN: [id number]  Date of admission: 07/26/2020 Delivery date:07/27/2020  Delivering provider: Arrie Senate  Date of discharge: 07/29/2020  Admitting diagnosis: Indication for care in labor and delivery, antepartum [O75.9] Intrauterine pregnancy: [redacted]w[redacted]d    Secondary diagnosis:  Active Problems:   Obesity affecting pregnancy   History of gestational hypertension   Supervision of high risk pregnancy, antepartum   Chronic hypertension   History of substance use disorder   Anxiety and depression   Chronic hypertension affecting pregnancy   Indication for care in labor and delivery, antepartum   Vaginal delivery   IUD (intrauterine device) in place  Additional problems: none    Discharge diagnosis: Term Pregnancy Delivered, elevated BP (no diagnosis), pre-diabetes                                              Post partum procedures:post placental liletta IUD placed Augmentation: AROM, Pitocin, Cytotec and IP Foley Complications: None  Hospital course: Induction of Labor With Vaginal Delivery   29y.o. yo [phone] 35w1das admitted to the hospital 07/26/2020 for induction of labor.  Indication for induction: elevated blood pressure (no diagnosis), prediabetes (a1c 6.0, failed 1hr gtt).  Induction progressed to complete with above noted agents. FHT was noted to have a prolonged decel so reported to bedside and began pushing. Cervical lip reduced and patient successfully delivered without complications. Post placental liletta placed. Please refer to delivery summary and procedure note for further details. Patient had an uncomplicated labor course as follows: Membrane Rupture Time/Date: 3:55 PM ,07/27/2020   Delivery Method:Vaginal, Spontaneous  Episiotomy: None  Lacerations:  None  Details of delivery can be found in separate delivery note.  Patient had a routine postpartum course. Given borderline  blood pressure, patient was started on norvasc 5 mg. Patient is discharged home 07/29/20.  Newborn Data: Birth date:07/27/2020  Birth time:7:18 PM  Gender:Female  Living status:Living  Apgars:4 ,9  Weight:3291 g   Magnesium Sulfate received: No BMZ received: No Rhophylac:N/A MMR:N/A T-DaP:Given prenatally Flu: No Transfusion:No  Physical exam  Vitals:   07/28/20 0230 07/28/20 0630 07/28/20 1400 07/28/20 2300  BP: 136/84 129/65 126/89 136/76  Pulse: 90 98 76 64  Resp: _0 Temp: 98.6 F (37 C) 97.7 F (36.5 C) 98.1 F (36.7 C) 98.9 F (37.2 C)  TempSrc: Oral Oral Oral Oral  SpO2: 100% 100% 100% 99%  Weight:      Height:       General: alert, cooperative and no distress Lochia: appropriate Uterine Fundus: firm Incision: Healing well with no significant drainage DVT Evaluation: No evidence of DVT seen on physical exam. Labs: Lab Results  Component Value Date   WBC 10.7 (H) 07/26/2020   HGB 12.2 07/26/2020   HCT 38.6 07/26/2020   MCV 86.9 07/26/2020   PLT 337 07/26/2020   CMP Latest Ref Rng & Units 07/26/2020  Glucose 70 - 99 mg/dL 107(H)  BUN 6 - 20 mg/dL 8  Creatinine 0.44 - 1.00 mg/dL 0.67  Sodium 135 - 145 mmol/L 137  Potassium 3.5 - 5.1 mmol/L 3.4(L)  Chloride 98 - 111 mmol/L 105  CO2 22 - 32 mmol/L 19(L)  Calcium 8.9 - 10.3 mg/dL 9.2  Total Protein 6.5 - 8.1 g/dL 6.6  Total  Bilirubin 0.3 - 1.2 mg/dL 0.3  Alkaline Phos 38 - 126 U/L 110  AST 15 - 41 U/L 15  ALT 0 - 44 U/L 15   Edinburgh Score: Edinburgh Postnatal Depression Scale Screening Tool 07/28/2020  I have been able to laugh and see the funny side of things. 0  I have looked forward with enjoyment to things. 0  I have blamed myself unnecessarily when things went wrong. 0  I have been anxious or worried for no good reason. 0  I have felt scared or panicky for no good reason. 0  Things have been getting on top of me. 0  I have been so unhappy that I have had difficulty sleeping. 0  I  have felt sad or miserable. 0  I have been so unhappy that I have been crying. 0  The thought of harming myself has occurred to me. 0  Edinburgh Postnatal Depression Scale Total 0     After visit meds:  Allergies as of 07/29/2020   No Known Allergies     Medication List    STOP taking these medications   aspirin EC 81 MG tablet     TAKE these medications   Accu-Chek Guide test strip Generic drug: glucose blood Use as instructed   Accu-Chek Guide w/Device Kit 1 kit by Does not apply route as directed.   Accu-Chek Softclix Lancets lancets 1 each by Other route 4 (four) times daily. Use as instructed   acetaminophen 325 MG tablet Commonly known as: Tylenol Take 2 tablets (650 mg total) by mouth every 6 (six) hours as needed (for pain scale < 4). What changed:   reasons to take this  additional instructions   amLODipine 5 MG tablet Commonly known as: NORVASC Take 1 tablet (5 mg total) by mouth daily.   folic acid 1 MG tablet Commonly known as: FOLVITE Take 1 tablet (1 mg total) by mouth daily.   ibuprofen 600 MG tablet Commonly known as: ADVIL Take 1 tablet (600 mg total) by mouth every 6 (six) hours.   ondansetron 4 MG disintegrating tablet Commonly known as: Zofran ODT Take 1 tablet (4 mg total) by mouth every 8 (eight) hours as needed for nausea.   oxyCODONE 5 MG immediate release tablet Commonly known as: Roxicodone Take 1 tablet (5 mg total) by mouth every 8 (eight) hours as needed.   prenatal vitamin w/FE, FA 29-1 MG Chew chewable tablet Chew 1 tablet by mouth daily at 12 noon.        Discharge home in stable condition Infant Feeding: Breast Infant Disposition:home with mother   Discharge instruction: per After Visit Summary and Postpartum booklet. Activity: Advance as tolerated. Pelvic rest for 6 weeks.  Diet: carb modified diet Future Appointments:No future appointments. Follow up Visit: Message sent to [facility] 07/27/20. Previously [facility]  patient, needs f/u at our practice given complexity.   Please schedule this patient for a In person postpartum visit in 4 weeks with the following provider: MD. Additional Postpartum F/U:2 hour GTT and BP check 1 week patient likely has pre-diabetes vs Type 2 DM, gtt at 4-6 week appt. BP check in 1 week. High risk pregnancy complicated by: HTN and pre-diabetes? Delivery mode:  Vaginal, Spontaneous  Anticipated Birth Control:  PP IUD placed-string check at postpartum appt   07/29/2020 Janet Berlin, MD

## 2020-07-27 NOTE — Progress Notes (Signed)
Labor Progress Note Robin [name] is a 29 y.o. [id number] at [redacted]w[redacted]d presented for IOL-elevated BP, pre-diabetes, BMI 62.  S: Doing well without complaints.  O:  BP 101/66   Pulse 90   Temp 98.1 F (36.7 C) (Oral)   Resp 16   Ht 5' (1.524 m)   Wt (!) 153.6 kg   LMP 11/30/2019   SpO2 100%   Breastfeeding Unknown   BMI 66.13 kg/m  EFM: baseline 130bpm/mod variability/+ accels/no decels Toco: q1-4 min   CVE: Dilation: 4 Effacement (%): 60 Cervical Position: Posterior Station: -3 Presentation: Vertex Exam by:: Davis,RN   A&P: 29 y.o. [id number] [redacted]w[redacted]d presented for IOL-elevated BP, pre-diabetes, BMI 62. #IOL: S/p cytotec x3 and FB (out ~0530). Pit started @0545 . Continue to titrate. #Pain: epidural #FWB: cat 1 #GBS negative #elevated BP without diagnosis: patient does not meet diagnostic criteria for gHTN or cHTN after review of prenatal records. Not on meds. PreE labs nml on admit. Asymptomatic. BP intermittently elevated since admission, no severe range pressures. Continue to monitor. #pre-diabetes: a1c 6.0, failed 1hr gtt, no 3hr gtt. Not on meds. q4 BGL checks during latent labor and q2 during active labor. 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 SW consult pp #Contraception: desires post placental liletta, ordered. Discussed risks/benefits with patient and consents signed.  , MD 12:35 PM

## 2020-07-27 NOTE — Discharge Instructions (Signed)
Levonorgestrel intrauterine device (IUD) What is this medicine? LEVONORGESTREL IUD ([name] voe nor jes trel) is a contraceptive (birth control) device. The device is placed inside the uterus by a healthcare professional. It is used to prevent pregnancy. This device can also be used to treat heavy bleeding that occurs during your period. This medicine may be used for other purposes; ask your health care provider or pharmacist if you have questions. COMMON BRAND NAME(S): Kyleena, LILETTA, Mirena, Skyla What should I tell my health care provider before I take this medicine? They need to know if you have any of these conditions:  abnormal Pap smear  cancer of the breast, uterus, or cervix  diabetes  endometritis  genital or pelvic infection now or in the past  have more than one sexual partner or your partner has more than one partner  heart disease  history of an ectopic or tubal pregnancy  immune system problems  IUD in place  liver disease or tumor  problems with blood clots or take blood-thinners  seizures  use intravenous drugs  uterus of unusual shape  vaginal bleeding that has not been explained  an unusual or allergic reaction to levonorgestrel, other hormones, silicone, or polyethylene, medicines, foods, dyes, or preservatives  pregnant or trying to get pregnant  breast-feeding How should I use this medicine? This device is placed inside the uterus by a health care professional. Talk to your pediatrician regarding the use of this medicine in children. Special care may be needed. Overdosage: If you think you have taken too much of this medicine contact a poison control center or emergency room at once. NOTE: This medicine is only for you. Do not share this medicine with others. What if I miss a dose? This does not apply. Depending on the brand of device you have inserted, the device will need to be replaced every 3 to 6 years if you wish to continue using this type  of birth control. What may interact with this medicine? Do not take this medicine with any of the following medications:  amprenavir  bosentan  fosamprenavir This medicine may also interact with the following medications:  aprepitant  armodafinil  barbiturate medicines for inducing sleep or treating seizures  bexarotene  boceprevir  griseofulvin  medicines to treat seizures like carbamazepine, ethotoin, felbamate, oxcarbazepine, phenytoin, topiramate  modafinil  pioglitazone  rifabutin  rifampin  rifapentine  some medicines to treat HIV infection like atazanavir, efavirenz, indinavir, lopinavir, nelfinavir, tipranavir, ritonavir  St. John's wort  warfarin This list may not describe all possible interactions. Give your health care provider a list of all the medicines, herbs, non-prescription drugs, or dietary supplements you use. Also tell them if you smoke, drink alcohol, or use illegal drugs. Some items may interact with your medicine. What should I watch for while using this medicine? Visit your doctor or health care professional for regular check ups. See your doctor if you or your partner has sexual contact with others, becomes HIV positive, or gets a sexual transmitted disease. This product does not protect you against HIV infection (AIDS) or other sexually transmitted diseases. You can check the placement of the IUD yourself by reaching up to the top of your vagina with clean fingers to feel the threads. Do not pull on the threads. It is a good habit to check placement after each menstrual period. Call your doctor right away if you feel more of the IUD than just the threads or if you cannot feel the threads at   all. The IUD may come out by itself. You may become pregnant if the device comes out. If you notice that the IUD has come out use a backup birth control method like condoms and call your health care provider. Using tampons will not change the position of the  IUD and are okay to use during your period. This IUD can be safely scanned with magnetic resonance imaging (MRI) only under specific conditions. Before you have an MRI, tell your healthcare provider that you have an IUD in place, and which type of IUD you have in place. What side effects may I notice from receiving this medicine? Side effects that you should report to your doctor or health care professional as soon as possible:  allergic reactions like skin rash, itching or hives, swelling of the face, lips, or tongue  fever, flu-like symptoms  genital sores  high blood pressure  no menstrual period for 6 weeks during use  pain, swelling, warmth in the leg  pelvic pain or tenderness  severe or sudden headache  signs of pregnancy  stomach cramping  sudden shortness of breath  trouble with balance, talking, or walking  unusual vaginal bleeding, discharge  yellowing of the eyes or skin Side effects that usually do not require medical attention (report to your doctor or health care professional if they continue or are bothersome):  acne  breast pain  change in sex drive or performance  changes in weight  cramping, dizziness, or faintness while the device is being inserted  headache  irregular menstrual bleeding within first 3 to 6 months of use  nausea This list may not describe all possible side effects. Call your doctor for medical advice about side effects. You may report side effects to FDA at 1-800-FDA-1088. Where should I keep my medicine? This does not apply. NOTE: This sheet is a summary. It may not cover all possible information. If you have questions about this medicine, talk to your doctor, pharmacist, or health care provider.  2020 Elsevier/Gold Standard (2018-06-30 13:22:01) Postpartum Care After Vaginal Delivery This sheet gives you information about how to care for yourself from the time you deliver your baby to up to 6-12 weeks after delivery  (postpartum period). Your health care provider may also give you more specific instructions. If you have problems or questions, contact your health care provider. Follow these instructions at home: Vaginal bleeding  It is normal to have vaginal bleeding (lochia) after delivery. Wear a sanitary pad for vaginal bleeding and discharge. ? During the first week after delivery, the amount and appearance of lochia is often similar to a menstrual period. ? Over the next few weeks, it will gradually decrease to a dry, yellow-brown discharge. ? For most women, lochia stops completely by 4-6 weeks after delivery. Vaginal bleeding can vary from woman to woman.  Change your sanitary pads frequently. Watch for any changes in your flow, such as: ? A sudden increase in volume. ? A change in color. ? Large blood clots.  If you pass a blood clot from your vagina, save it and call your health care provider to discuss. Do not flush blood clots down the toilet before talking with your health care provider.  Do not use tampons or douches until your health care provider says this is safe.  If you are not breastfeeding, your period should return 6-8 weeks after delivery. If you are feeding your child breast milk only (exclusive breastfeeding), your period may not return until you stop breastfeeding.   Perineal care  Keep the area between the vagina and the anus (perineum) clean and dry as told by your health care provider. Use medicated pads and pain-relieving sprays and creams as directed.  If you had a cut in the perineum (episiotomy) or a tear in the vagina, check the area for signs of infection until you are healed. Check for: ? More redness, swelling, or pain. ? Fluid or blood coming from the cut or tear. ? Warmth. ? Pus or a bad smell.  You may be given a squirt bottle to use instead of wiping to clean the perineum area after you go to the bathroom. As you start healing, you may use the squirt bottle before  wiping yourself. Make sure to wipe gently.  To relieve pain caused by an episiotomy, a tear in the vagina, or swollen veins in the anus (hemorrhoids), try taking a warm sitz bath 2-3 times a day. A sitz bath is a warm water bath that is taken while you are sitting down. The water should only come up to your hips and should cover your buttocks. Breast care  Within the first few days after delivery, your breasts may feel heavy, full, and uncomfortable (breast engorgement). Milk may also leak from your breasts. Your health care provider can suggest ways to help relieve the discomfort. Breast engorgement should go away within a few days.  If you are breastfeeding: ? Wear a bra that supports your breasts and fits you well. ? Keep your nipples clean and dry. Apply creams and ointments as told by your health care provider. ? You may need to use breast pads to absorb milk that leaks from your breasts. ? You may have uterine contractions every time you breastfeed for up to several weeks after delivery. Uterine contractions help your uterus return to its normal size. ? If you have any problems with breastfeeding, work with your health care provider or lactation consultant.  If you are not breastfeeding: ? Avoid touching your breasts a lot. Doing this can make your breasts produce more milk. ? Wear a good-fitting bra and use cold packs to help with swelling. ? Do not squeeze out (express) milk. This causes you to make more milk. Intimacy and sexuality  Ask your health care provider when you can engage in sexual activity. This may depend on: ? Your risk of infection. ? How fast you are healing. ? Your comfort and desire to engage in sexual activity.  You are able to get pregnant after delivery, even if you have not had your period. If desired, talk with your health care provider about methods of birth control (contraception). Medicines  Take over-the-counter and prescription medicines only as told by  your health care provider.  If you were prescribed an antibiotic medicine, take it as told by your health care provider. Do not stop taking the antibiotic even if you start to feel better. Activity  Gradually return to your normal activities as told by your health care provider. Ask your health care provider what activities are safe for you.  Rest as much as possible. Try to rest or take a nap while your baby is sleeping. Eating and drinking   Drink enough fluid to keep your urine pale yellow.  Eat high-fiber foods every day. These may help prevent or relieve constipation. High-fiber foods include: ? Whole grain cereals and breads. ? Brown rice. ? Beans. ? Fresh fruits and vegetables.  Do not try to lose weight quickly by cutting back   on calories.  Take your prenatal vitamins until your postpartum checkup or until your health care provider tells you it is okay to stop. Lifestyle  Do not use any products that contain nicotine or tobacco, such as cigarettes and e-cigarettes. If you need help quitting, ask your health care provider.  Do not drink alcohol, especially if you are breastfeeding. General instructions  Keep all follow-up visits for you and your baby as told by your health care provider. Most women visit their health care provider for a postpartum checkup within the first 3-6 weeks after delivery. Contact a health care provider if:  You feel unable to cope with the changes that your child brings to your life, and these feelings do not go away.  You feel unusually sad or worried.  Your breasts become red, painful, or hard.  You have a fever.  You have trouble holding urine or keeping urine from leaking.  You have little or no interest in activities you used to enjoy.  You have not breastfed at all and you have not had a menstrual period for 12 weeks after delivery.  You have stopped breastfeeding and you have not had a menstrual period for 12 weeks after you  stopped breastfeeding.  You have questions about caring for yourself or your baby.  You pass a blood clot from your vagina. Get help right away if:  You have chest pain.  You have difficulty breathing.  You have sudden, severe leg pain.  You have severe pain or cramping in your lower abdomen.  You bleed from your vagina so much that you fill more than one sanitary pad in one hour. Bleeding should not be heavier than your heaviest period.  You develop a severe headache.  You faint.  You have blurred vision or spots in your vision.  You have bad-smelling vaginal discharge.  You have thoughts about hurting yourself or your baby. If you ever feel like you may hurt yourself or others, or have thoughts about taking your own life, get help right away. You can go to the nearest emergency department or call:  Your local emergency services (911 in the [place]  A suicide crisis helpline, such as the National Suicide Prevention Lifeline at 1-800-273-8255. This is open 24 hours a day. Summary  The period of time right after you deliver your newborn up to 6-12 weeks after delivery is called the postpartum period.  Gradually return to your normal activities as told by your health care provider.  Keep all follow-up visits for you and your baby as told by your health care provider. This information is not intended to replace advice given to you by your health care provider. Make sure you discuss any questions you have with your health care provider. Document Revised: 08/22/2017 Document Reviewed: 06/02/2017 Elsevier Patient Education  2020 Elsevier Inc.  

## 2020-07-27 NOTE — Procedures (Signed)
  Post-Placental IUD Insertion Procedure Note  Patient identified, informed consent signed prior to delivery, signed copy in chart, time out was performed.    Vaginal, labial and perineal areas thoroughly inspected for lacerations. No lacerations identified.  Liletta  - IUD grasped between sterile gloved fingers. Sterile lubrication applied to sterile gloved hand for ease of insertion. Fundus identified through abdominal wall using non-insertion hand. IUD inserted to fundus with bimanual technique. IUD carefully released at the fundus and insertion hand gently removed from vagina.   Patient given post procedure instructions and IUD care card with expiration date.  Patient is asked to keep IUD strings tucked in her vagina until her postpartum follow up visit in 4-6 weeks. Patient advised to abstain from sexual intercourse and pulling on strings before her follow-up visit. Patient verbalized an understanding of the plan of care and agrees.   Alric Seton, MD OB Fellow, Faculty [facility] 07/27/2020 8:40 PM

## 2020-07-27 NOTE — Progress Notes (Signed)
   Robin [name] is a 29 y.o. [phone] at [redacted]w[redacted]d  admitted for induction of labor due to elevated BP without diagnosis of gestational hypertension of chronic hypertension.   Subjective:  Coping well, s/p fentanyl. Feels like the FB is "doing its job".    Objective: Vitals:   07/26/20 2054 07/26/20 2213 07/26/20 2319 07/26/20 2359  BP: (!) 127/102 113/75 (!) 117/52 100/79  Pulse: (!) 117 89 87 92  Resp: 18 18  20   Temp:   98.1 F (36.7 C)   TempSrc:   Oral   Weight:      Height:       No intake/output data recorded.  FHT:  FHR: 130 bpm, variability: moderate,  accelerations:  Present,  decelerations:  Absent UC:   Uterine irratability SVE:   Dilation: 1 Effacement (%): Thick Station: Ballotable Exam by:: [email or web address] CNM   Labs: Lab Results  Component Value Date   WBC 10.7 (H) 07/26/2020   HGB 12.2 07/26/2020   HCT 38.6 07/26/2020   MCV 86.9 07/26/2020   PLT 337 07/26/2020    Assessment / Plan: early labor, s/p cytotec x 2 and FB still in place  Labor: Progressing normally Fetal Wellbeing:  Category I Pain Control:  IV pain meds Anticipated MOD:  NSVD  07/28/2020 Robin [name] 07/27/2020, 1:00 AM

## 2020-07-27 NOTE — Progress Notes (Signed)
   Robin [name] is a 29 y.o. [id number] at [redacted]w[redacted]d  admitted for IOL for elevated BP at 39 weeks without diagnosis of GHTN.   Subjective:  Coping well; FB still in place. Has been using IV fentanyl for pain relief.  Objective: Vitals:   07/26/20 2319 07/26/20 2359 07/27/20 0106 07/27/20 0209  BP: (!) 117/52 100/79 103/66 (!) 103/54  Pulse: 87 92 86 82  Resp:  20 20 20   Temp: 98.1 F (36.7 C)     TempSrc: Oral     Weight:      Height:       Total I/O In: 1253 [I.V.:1253] Out: -   FHT:  FHR: 125 bpm, variability: moderate,  accelerations:  Abscent,  decelerations:  Absent UC:   q 3 min SVE:  Deferred  Labs: Lab Results  Component Value Date   WBC 10.7 (H) 07/26/2020   HGB 12.2 07/26/2020   HCT 38.6 07/26/2020   MCV 86.9 07/26/2020   PLT 337 07/26/2020    Assessment / Plan: Early labor  Continue cytotec  Consider starting pitocin in am  Labor: early labor Fetal Wellbeing:  Category I Pain Control:  Epidural and IV pain meds Anticipated MOD:  NSVD  07/28/2020 [name] 07/27/2020, 4:34 AM

## 2020-07-27 NOTE — Progress Notes (Signed)
Labor Progress Note Neima Lacross is a 29 y.o. [id number] at [redacted]w[redacted]d presented for IOL-elevated BP, pre-diabetes, BMI 62.  S: Doing well without complaints.  O:  BP 115/70   Pulse 70   Temp 98.1 F (36.7 C) (Oral)   Resp 16   Ht 5' (1.524 m)   Wt (!) 153.6 kg   LMP 11/30/2019   SpO2 100%   Breastfeeding Unknown   BMI 66.13 kg/m  EFM: baseline 130bpm/mod variability/+ accels/no decels Toco: q1-4 min   CVE: Dilation: 5 Effacement (%): 50 Cervical Position: Posterior Station: -2 Presentation: Vertex Exam by:: Maxie Debose   A&P: 29 y.o. [id number] [redacted]w[redacted]d presented for IOL-elevated BP, pre-diabetes, BMI 62. #IOL: S/p cytotec x3 and FB (out ~0530). Pit started @0545 . Continue to titrate. Risks/benefits of AROM discussed with patient, patient amedable, performed @1600 . #Pain: epidural #FWB: cat 1 #GBS negative #elevated BP without diagnosis: patient does not meet diagnostic criteria for gHTN or cHTN after review of prenatal records. Not on meds. PreE labs nml on admit. Asymptomatic. BP intermittently elevated since admission, no severe range pressures. Continue to monitor. #pre-diabetes: a1c 6.0, failed 1hr gtt, no 3hr gtt. Not on meds. q4 BGL checks during latent labor and q2 during active labor. 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 SW consult pp #Contraception: desires post placental liletta, ordered. Discussed risks/benefits with patient and consents signed.  , MD 4:21 PM

## 2020-07-28 DIAGNOSIS — O99815 Abnormal glucose complicating the puerperium: Secondary | ICD-10-CM

## 2020-07-28 DIAGNOSIS — O99325 Drug use complicating the puerperium: Secondary | ICD-10-CM

## 2020-07-28 DIAGNOSIS — R030 Elevated blood-pressure reading, without diagnosis of hypertension: Secondary | ICD-10-CM

## 2020-07-28 DIAGNOSIS — F1290 Cannabis use, unspecified, uncomplicated: Secondary | ICD-10-CM

## 2020-07-28 DIAGNOSIS — Z975 Presence of (intrauterine) contraceptive device: Secondary | ICD-10-CM

## 2020-07-28 DIAGNOSIS — O99893 Other specified diseases and conditions complicating puerperium: Secondary | ICD-10-CM

## 2020-07-28 LAB — GLUCOSE, CAPILLARY: Glucose-Capillary: 87 mg/dL (ref 70–99)

## 2020-07-28 NOTE — Clinical Social Work Maternal (Signed)
CLINICAL SOCIAL WORK MATERNAL/CHILD NOTE  Patient Details  Name: Robin [name] MRN: [id number] Date of Birth: June 18, 1991  Date:  07/28/2020  Clinical Social Worker Initiating Note:  Durward Fortes. LCSW Date/Time: Initiated:  07/28/20/0830     Child's Name:  Grandville Silos   Biological Parents:  Mother, Father Cana Mignano, Marlow Baars)   Need for Interpreter:  None   Reason for Referral:  Behavioral Health Concerns, Current Substance Use/Substance Use During Pregnancy    Address:  [place] [address]    Phone number:  [phone] (home)     Additional phone number: none   Household Members/Support Persons (HM/SP):   Household Member/Support Person 1, Household Member/Support Person 2   HM/SP Name Relationship DOB or Age  HM/SP -1  Charisma Kingsbury [name] [birth year]  HM/SP -2 Shaquan Johnosn FOB 10/09/1990  HM/SP -3 Shanelle Gilmer daughter  10/20/2005  HM/SP -4  Johnn Hai daughter 08/10/2007  HM/SP -5 Nyla Johnson Daughter  04/19/2016  HM/SP -[place] daughter 04/30/2018  HM/SP -7        HM/SP -8          Natural Supports (not living in the home):  Friends, Extended Family, Immediate Family   Professional Supports: None   Employment: Unemployed   Type of Work: none   Education:  High school graduate   Homebound arranged:  n/a  Museum/gallery curator Resources:  Medicaid   Other Resources:  Physicist, medical , [name] Considerations Which May Impact Care:  none   Strengths:  Ability to meet basic needs , Compliance with medical plan , Home prepared for child , Pediatrician chosen   Psychotropic Medications:       [name] reported being given Zoloft in the past however [name] expressed not picking up medication due to insurance needs.   Pediatrician:    [facility]  Pediatrician List:   [place] Other ([place])  [facility]       Pediatrician Fax Number:    Risk Factors/Current Problems:  Substance Use    Cognitive State:  Alert , Able to Concentrate , Insightful    Mood/Affect:  Relaxed , Comfortable , Calm , Interested , Happy    CSW Assessment: CSW consulted due to [name] reporting THC use while pregnant as well as having a hx of anxiety and depression. CSW met with [name] at bedside to address further needs and concerns.   CSW congratulated [name] on the birth of infant. CSW advised [name] of CSW's role and the reason for CSW coming to speak with her. [name] expressed that she was diagnosed with anxiety and depression "awhile back". [name] expressed challenges in her past in regards to housing needs and other social stressors. [name] expressed that for the past "2-3 years I have been doing good". CSW praised [name] for this and inquired from [facility] on medication or therapy use to help with anxiety and depression. [name] expressed that she was prescribed Zoloft in the past however [name] expressed "I didn't have insurance and I wasn't working so I couldn't afford it". CSW understanding of this. [name] was offered resources now for therapy and medication in which [name] declined both at this time. CSW inquired from [facility] on safety in which [name] expressed no feelings of SI or HI and reported no DV to this CSW. [name] expressed no other mental health hx to this  CSW.   CSW inquired from [facility] on her substance use while pregnant. CSW was advised by [name] that she did use THC while pregnancy. [name] expressed that she had a number of health issues that led [name] to using THC while pregnant. [name] reported to CSW that she used THC "once or twice a week and some weeks I didn't use at all". [name] expressed to CSW that she last used THC a little over a week ago. CSW understanding of this however did advise [name] of the hospital drug screen policy. [name] was advised that infants UDS has returned positive for [facility] therefore CSW would be making CPS report to [facility]. [name]  expressed that she understood CSW advised [name] That if infants CDS returns positive for other substances then CSW would need to make a new CPS report. Again [name] expressed that she understood and expressed no other substance use. [name] did admit that she uses THC "recreationally-im a smoker". CSW inquired from [facility] on previous and current CPS hx in which [name] expressed previous hx with CPS. [name] expressed that her previous CPS hx "is with THC with my other three kids as well". [name] advised CSW that two were in [facility] while the other was "[facility]". [name] reported all cases currently being closed to [name]'s knowledge.   CSW inquired from [facility] on who her supports were during this time. [name] advised CSW that she has support from [place]) FOB , mother and other family and friends. [name] expressed that she has all needed items to care for infant with plans for infant to sleep in basinet once arrived home. [name] reported to CSW that she gets [facility] and Physicist, medical. [name] reported having transportation  barrier's to getting to care, however [name] then advised CSW that she uses the [employer] services to get to and from places. [name] expressed being given a number in the past "so we call them. That is how I got to the hospital this time". CSW understanding as [name] went on to advise CSW that her insurance is not helpful in getting her transportation needs met. [name] advised CSW that when she discharges from here she will be picked up by a friend and then will call [facility] for pediatric appointment for infant. CSW offered to assist with this however [name] expressed the desire to do this herself.  CSW provided [name] with PPD and SIDS education. [name] was given PPD Checklist to keep track of her feelings as they may relate to PPD. [name] thanked CSW and reported no other needs.   CSW currently making CPS report due to infants positive THC UDS. No barrier's at this time.   CSW Plan/Description:  No Further Intervention  Required/No Barriers to Discharge, Sudden Infant Death Syndrome (SIDS) Education, Perinatal Mood and Anxiety Disorder (PMADs) Education, [place], CSW Will Continue to Monitor Umbilical Cord Tissue Drug Screen Results and Make Report if [place], Child Protective Service Report     Wetzel Bjornstad, [place] 07/28/2020, 9:00 AM

## 2020-07-28 NOTE — Progress Notes (Signed)
Post Partum Day 1 Subjective:  Patient is doing well without complaints. Ambulating without difficulty. Voiding and passing flatus. Tolerating PO. Abdominal pain improved. Vaginal bleeding decreased.   Objective: Blood pressure 129/65, pulse 98, temperature 97.7 F (36.5 C), temperature source Oral, resp. rate 17, height 5' (1.524 m), weight (!) 153.6 kg, last menstrual period 11/30/2019, SpO2 100 %, unknown if currently breastfeeding.  Physical Exam:  General: alert, cooperative and no distress Lochia: appropriate Uterine Fundus: firm Incision: n/a DVT Evaluation: No evidence of DVT seen on physical exam.  Recent Labs    07/26/20 1530  HGB 12.2  HCT 38.6    Assessment/Plan: PPD#1 uncomplicated VD  -meeting pp milestones, doing well  -liletta IUD placed post placentally  -bottle feeding  -desires circ, ordered/consented  #THC use in pregnancy: SW consulted  #Elevated 1hr gtt, no 3hr, elevated a1c: needs 2hr gtt postpartum, fasting cbg nml today  #Elevated BP: no diagnosis, has not met criteria, continue to monitor.  Given time of delivery, consider discharge tomorrow.   LOS: 2 days   Arrie Senate 32/25/6720, 7:15 AM

## 2020-07-28 NOTE — Anesthesia Postprocedure Evaluation (Signed)
Anesthesia Post Note  Patient: Robin [name]  Procedure(s) Performed: AN AD HOC LABOR EPIDURAL     Patient location during evaluation: Mother Baby Anesthesia Type: Epidural Level of consciousness: awake and alert Pain management: pain level controlled Vital Signs Assessment: post-procedure vital signs reviewed and stable Respiratory status: spontaneous breathing, nonlabored ventilation and respiratory function stable Cardiovascular status: stable Postop Assessment: no headache, no backache and epidural receding Anesthetic complications: no   No complications documented.  Last Vitals:  Vitals:   07/28/20 0230 07/28/20 0630  BP: 136/84 129/65  Pulse: 90 98  Resp: 18 17  Temp: 37 C 36.5 C  SpO2: 100% 100%    Last Pain:  Vitals:   07/28/20 0630  TempSrc: Oral  PainSc:    Pain Goal:                   Rica Records

## 2020-07-29 DIAGNOSIS — E669 Obesity, unspecified: Secondary | ICD-10-CM

## 2020-07-29 DIAGNOSIS — F329 Major depressive disorder, single episode, unspecified: Secondary | ICD-10-CM

## 2020-07-29 DIAGNOSIS — O99345 Other mental disorders complicating the puerperium: Secondary | ICD-10-CM

## 2020-07-29 DIAGNOSIS — O99215 Obesity complicating the puerperium: Secondary | ICD-10-CM

## 2020-07-29 DIAGNOSIS — F419 Anxiety disorder, unspecified: Secondary | ICD-10-CM

## 2020-07-29 MED ORDER — ACETAMINOPHEN 325 MG PO TABS: 650.0000 mg | ORAL_TABLET | Freq: Four times a day (QID) | ORAL | Status: AC | PRN

## 2020-07-29 MED ORDER — OXYCODONE HCL 5 MG PO TABS
5.0000 mg | ORAL_TABLET | Freq: Three times a day (TID) | ORAL | 0 refills | Status: AC | PRN
Start: 2020-07-29 — End: 2021-07-29

## 2020-07-29 MED ORDER — AMLODIPINE BESYLATE 5 MG PO TABS
5.0000 mg | ORAL_TABLET | Freq: Every day | ORAL | Status: DC
  Administered 2020-07-29: 5 mg via ORAL
  Filled 2020-07-29: qty 1

## 2020-07-29 MED ORDER — IBUPROFEN 600 MG PO TABS
600.0000 mg | ORAL_TABLET | Freq: Four times a day (QID) | ORAL | 0 refills | Status: DC
Start: 2020-07-29 — End: 2023-08-06

## 2020-07-29 MED ORDER — AMLODIPINE BESYLATE 5 MG PO TABS
5.0000 mg | ORAL_TABLET | Freq: Every day | ORAL | 0 refills | Status: DC
Start: 2020-07-29 — End: 2024-02-04

## 2020-07-31 ENCOUNTER — Telehealth: Payer: Self-pay

## 2020-07-31 NOTE — Telephone Encounter (Signed)
Clint Bolder w/[facility] reports patient has contacting them to request transportation for her and her infant to [facility] @[address] [place], [place]. They advised they will have to call her back. They need to verify w/referring provider to see if they will be able to cover the transportation for patient to be transported to [facility]. They are outside of [facility]. They transport inside of [facility]. If referred out they have to inquire who will be responsible for the transportation. [facility] ask for Ms.Mo.

## 2020-07-31 NOTE — Telephone Encounter (Signed)
Spoke w/[facility]. Advised patient was delivered in [place]. Delivering/Discharging MD would be who referred her to Pediatrician. Advised I will send message to Practice Admin who set patient up with transportation.

## 2020-08-07 ENCOUNTER — Ambulatory Visit: Payer: Medicaid Other

## 2020-08-29 ENCOUNTER — Ambulatory Visit: Payer: Medicaid Other | Admitting: Obstetrics and Gynecology

## 2020-10-17 ENCOUNTER — Ambulatory Visit: Payer: Medicaid Other | Admitting: Advanced Practice Midwife

## 2020-10-26 ENCOUNTER — Ambulatory Visit (INDEPENDENT_AMBULATORY_CARE_PROVIDER_SITE_OTHER): Payer: Medicaid Other | Admitting: Obstetrics and Gynecology

## 2020-10-26 ENCOUNTER — Encounter: Payer: Self-pay | Admitting: Obstetrics and Gynecology

## 2020-10-26 ENCOUNTER — Other Ambulatory Visit: Payer: Self-pay

## 2020-10-26 ENCOUNTER — Ambulatory Visit: Payer: Medicaid Other | Admitting: Advanced Practice Midwife

## 2020-10-26 VITALS — BP 160/90 | Ht 61.0 in | Wt 326.0 lb

## 2020-10-26 DIAGNOSIS — Z30013 Encounter for initial prescription of injectable contraceptive: Secondary | ICD-10-CM | POA: Diagnosis not present

## 2020-10-26 DIAGNOSIS — T8332XA Displacement of intrauterine contraceptive device, initial encounter: Secondary | ICD-10-CM

## 2020-10-26 DIAGNOSIS — Z3202 Encounter for pregnancy test, result negative: Secondary | ICD-10-CM | POA: Diagnosis not present

## 2020-10-26 DIAGNOSIS — Z30432 Encounter for removal of intrauterine contraceptive device: Secondary | ICD-10-CM | POA: Diagnosis not present

## 2020-10-26 DIAGNOSIS — N9419 Other specified dyspareunia: Secondary | ICD-10-CM

## 2020-10-26 LAB — POCT URINE PREGNANCY: Preg Test, Ur: NEGATIVE

## 2020-10-26 MED ORDER — MEDROXYPROGESTERONE ACETATE 150 MG/ML IM SUSP: 150.0000 mg | INTRAMUSCULAR | 0 refills | Status: DC

## 2020-10-26 NOTE — Progress Notes (Signed)
Rod Can, CNM   Chief Complaint  Patient presents with  . IUD check    Pt felt strings outside vag area when wiping x 2-3 weeks    HPI:      Ms. Robin [name] is a 30 y.o. [id number] whose LMP was No LMP recorded. (Menstrual status: IUD)., presents today for IUD removal. Had mirena placed PP 11/22. Did well at first but has felt strings in vagina for past month with regular bleeding now and cramping. Also with dyspareunia. She would like it removed and wants to restart depo. Did in past without problems. Never did PP visit. No new sex partners.  Current on pap.  Past Medical History:  Diagnosis Date  . Anemia    4th pregnancy  . Asthma    No Inhaler  . Depression    Hx of Depression, refused medication.  Marland Kitchen History of urinary tract infection   . Preeclampsia   . Pregnancy induced hypertension    With all previous pregnancies  . Spontaneous abortion 06/2015    Past Surgical History:  Procedure Laterality Date  . NO PAST SURGERIES      Family History  Problem Relation Age of Onset  . Hypertension Mother   . Heart disease Father   . Cancer Maternal Aunt   . Cancer Paternal Grandmother   . Diabetes Maternal Grandmother     Social History   Socioeconomic History  . Marital status: Significant Other    Spouse name: Not on file  . Number of children: 3  . Years of education: 19  . Highest education level: Not on file  Occupational History  . Not on file  Tobacco Use  . Smoking status: Current Every Day Smoker    Packs/day: 0.50    Years: 12.00    Pack years: 6.00    Types: Cigarettes  . Smokeless tobacco: Never Used  Vaping Use  . Vaping Use: Never used  Substance and Sexual Activity  . Alcohol use: Not Currently    Comment: occasionally not since pregnancy  . Drug use: Yes    Types: Marijuana  . Sexual activity: Yes    Birth control/protection: None, I.U.D.    Comment: Last BCM was Depo ~ 2 years ago.  Other Topics Concern  . Not on file   Social History Narrative  . Not on file   Social Determinants of Health   Financial Resource Strain: Not on file  Food Insecurity: Not on file  Transportation Needs: Not on file  Physical Activity: Not on file  Stress: Not on file  Social Connections: Not on file  Intimate Partner Violence: Not At Risk  . Fear of Current or Ex-Partner: No  . Emotionally Abused: No  . Physically Abused: No  . Sexually Abused: No    Outpatient Medications Prior to Visit  Medication Sig Dispense Refill  . Accu-Chek Softclix Lancets lancets 1 each by Other route 4 (four) times daily. Use as instructed 100 each 12  . acetaminophen (TYLENOL) 325 MG tablet Take 2 tablets (650 mg total) by mouth every 6 (six) hours as needed (for pain scale < 4).    . Blood Glucose Monitoring Suppl (ACCU-CHEK GUIDE) w/Device KIT 1 kit by Does not apply route as directed. 1 kit 0  . glucose blood (ACCU-CHEK GUIDE) test strip Use as instructed 100 each 12  . ibuprofen (ADVIL) 600 MG tablet Take 1 tablet (600 mg total) by mouth every 6 (six) hours. 30 tablet 0  .  prenatal vitamin w/FE, FA (NATACHEW) 29-1 MG CHEW chewable tablet Chew 1 tablet by mouth daily at 12 noon. 30 tablet 8  . amLODipine (NORVASC) 5 MG tablet Take 1 tablet (5 mg total) by mouth daily. (Patient not taking: Reported on 10/26/2020) 30 tablet 0  . oxyCODONE (ROXICODONE) 5 MG immediate release tablet Take 1 tablet (5 mg total) by mouth every 8 (eight) hours as needed. (Patient not taking: Reported on 10/26/2020) 5 tablet 0  . folic acid (FOLVITE) 1 MG tablet Take 1 tablet (1 mg total) by mouth daily. 30 tablet 10  . ondansetron (ZOFRAN ODT) 4 MG disintegrating tablet Take 1 tablet (4 mg total) by mouth every 8 (eight) hours as needed for nausea. 20 tablet 1   No facility-administered medications prior to visit.      ROS:  Review of Systems  Constitutional: Negative for fever, malaise/fatigue and weight loss.  Gastrointestinal: Negative for blood in  stool, constipation, diarrhea, nausea and vomiting.  Genitourinary: Positive for vaginal bleeding. Negative for dyspareunia, dysuria, flank pain, frequency, hematuria, urgency, vaginal discharge and vaginal pain.  Musculoskeletal: Negative for back pain.  Skin: Negative for itching and rash.    OBJECTIVE:   Vitals:  BP (!) 160/90   Ht 5' 1" (1.549 m)   Wt (!) 326 lb (147.9 kg)   Breastfeeding Yes   BMI 61.60 kg/m   Physical Exam Vitals reviewed.  Constitutional:      Appearance: She is well-developed.  Pulmonary:     Effort: Pulmonary effort is normal.  Genitourinary:    General: Normal vulva.     Pubic Area: No rash.      Labia:        Right: No rash, tenderness or lesion.        Left: No rash, tenderness or lesion.      Vagina: Normal. No vaginal discharge, erythema or tenderness.     Cervix: Normal.     Uterus: Normal. Not enlarged and not tender.      Adnexa: Right adnexa normal and left adnexa normal.       Right: No mass or tenderness.         Left: No mass or tenderness.       Comments: LONG IUD STRINGS IN VAGINA Musculoskeletal:        General: Normal range of motion.     Cervical back: Normal range of motion.  Skin:    General: Skin is warm and dry.  Neurological:     General: No focal deficit present.     Mental Status: She is alert and oriented to person, place, and time.  Psychiatric:        Mood and Affect: Mood normal.        Behavior: Behavior normal.        Thought Content: Thought content normal.        Judgment: Judgment normal.     Results: Results for orders placed or performed in visit on 10/26/20 (from the past 24 hour(s))  POCT urine pregnancy     Status: Normal   Collection Time: 10/26/20  4:15 PM  Result Value Ref Range   Preg Test, Ur Negative Negative    IUD Removal Strings of IUD identified and grasped.  IUD removed without problem with ring forceps.  Pt tolerated this well.  IUD noted to be  intact.   Assessment/Plan: Encounter for initial prescription of injectable contraceptive - Plan: medroxyPROGESTERone (DEPO-PROVERA) 150 MG/ML injection, POCT urine pregnancy; Rx depo,  given today after neg UPT. Recheck UPT in 1 mo after being on depo due to malpositioned IUD. Condoms for 1 wk. RTO in 2 months for annual.  Malpositioned intrauterine device (IUD), initial encounter - Plan: POCT urine pregnancy; neg UPT. IUD removed today. Pt tolerated well.  Encounter for IUD removal    Meds ordered this encounter  Medications  . medroxyPROGESTERone (DEPO-PROVERA) 150 MG/ML injection    Sig: Inject 1 mL (150 mg total) into the muscle every 3 (three) months.    Dispense:  1 mL    Refill:  0    Order Specific Question:   Supervising Provider    Answer:   Gae Dry [[id number]]      Return in about 2 months (around 2/63/7858) for annual.  Alicia B. Copland, PA-C 10/26/2020 4:15 PM

## 2020-10-26 NOTE — Patient Instructions (Signed)
I value your feedback and you entrusting us with your care. If you get a [facility] patient survey, I would appreciate you taking the time to let us know about your experience today. Thank you! ? ? ?

## 2020-10-31 ENCOUNTER — Ambulatory Visit: Payer: Medicaid Other

## 2020-12-18 ENCOUNTER — Ambulatory Visit: Payer: Medicaid Other

## 2020-12-25 ENCOUNTER — Ambulatory Visit: Payer: Medicaid Other

## 2021-07-16 ENCOUNTER — Encounter: Payer: Self-pay | Admitting: *Deleted

## 2023-07-19 ENCOUNTER — Other Ambulatory Visit: Payer: Self-pay

## 2023-07-19 ENCOUNTER — Emergency Department
Admission: EM | Admit: 2023-07-19 | Discharge: 2023-07-20 | Disposition: A | Discharge status: Home / Self Care | Payer: Medicaid Other | Attending: Emergency Medicine | Admitting: Emergency Medicine

## 2023-07-19 DIAGNOSIS — Z3201 Encounter for pregnancy test, result positive: Secondary | ICD-10-CM | POA: Insufficient documentation

## 2023-07-19 DIAGNOSIS — Z5321 Procedure and treatment not carried out due to patient leaving prior to being seen by health care provider: Secondary | ICD-10-CM | POA: Insufficient documentation

## 2023-07-19 LAB — POC URINE PREG, ED: Preg Test, Ur: POSITIVE — AB

## 2023-07-19 NOTE — ED Notes (Signed)
Urine preg POSITIVE

## 2023-07-19 NOTE — ED Triage Notes (Signed)
Pt to ed from home via POV for a pregnancy test. Pt is caox4, in no acute distress and ambulatory in triage. LMP was 9/26 and she has taken two tests at home and they were both positive.

## 2023-07-20 NOTE — ED Notes (Signed)
Pt to desk and advised she was leaving.

## 2023-07-25 ENCOUNTER — Ambulatory Visit: Payer: Self-pay

## 2023-07-25 ENCOUNTER — Telehealth: Payer: Self-pay

## 2023-07-25 VITALS — BP 149/77 | Ht 61.0 in | Wt 329.0 lb

## 2023-07-25 DIAGNOSIS — O3680X0 Pregnancy with inconclusive fetal viability, not applicable or unspecified: Secondary | ICD-10-CM

## 2023-07-25 DIAGNOSIS — Z3689 Encounter for other specified antenatal screening: Secondary | ICD-10-CM

## 2023-07-25 NOTE — Progress Notes (Addendum)
    NURSE VISIT NOTE  Subjective:    Patient ID: Robin [name], female    DOB: 02-02-1991, 32 y.o.   MRN: [id number]  HPI  Patient is a 32 y.o. [phone] female who presents for evaluation of amenorrhea. She believes she could be pregnant. Pregnancy is desired.  Last period was normal.Pregnancy test was done by [facility] hospital already in chart, she left before being seen by provider at hospital. Pt asked for letter for job and was explained the next apts that are needed.     Objective:    BP (!) 149/77   Ht 5\' 1"  (1.549 m)   Wt (!) 329 lb (149.2 kg)   LMP 05/29/2023 Comment: IUD removed  BMI 62.16 kg/m   Lab Review  No results found for any visits on 07/25/23.  Assessment:   1. Encounter to determine fetal viability of pregnancy, single or unspecified fetus     Plan:   Pregnancy Test: Positive  Estimated Date of Delivery: None noted. BP Cuff Measurement taken. Cuff Size Adult Large Long Will schedule BP check Discussed self-help for nausea, avoiding OTC medications until consulting provider or pharmacist, other than Tylenol as needed, minimal caffeine (1-2 cups daily) and avoiding alcohol.   She will schedule her nurse visit @ 7-[redacted] wks pregnant, u/s for dating @10  wk, and NOB visit at [redacted] wk pregnant.    Feel free to call with any questions.     Loney Laurence, CMA

## 2023-08-01 ENCOUNTER — Ambulatory Visit: Admission: RE | Admit: 2023-08-01 | Payer: Self-pay | Source: Ambulatory Visit

## 2023-08-04 NOTE — Telephone Encounter (Signed)
Patient called back to schedule u/s. Call was transferred to [facility].

## 2023-08-06 ENCOUNTER — Telehealth (INDEPENDENT_AMBULATORY_CARE_PROVIDER_SITE_OTHER): Payer: Self-pay

## 2023-08-06 DIAGNOSIS — O0990 Supervision of high risk pregnancy, unspecified, unspecified trimester: Secondary | ICD-10-CM | POA: Insufficient documentation

## 2023-08-06 DIAGNOSIS — Z3480 Encounter for supervision of other normal pregnancy, unspecified trimester: Secondary | ICD-10-CM | POA: Insufficient documentation

## 2023-08-06 DIAGNOSIS — Z3689 Encounter for other specified antenatal screening: Secondary | ICD-10-CM

## 2023-08-06 NOTE — Progress Notes (Signed)
New OB Intake  I connected with  Robin [name] on 08/06/23 at 10:15 AM EST by Video Visit and verified that I am speaking with the correct person using two identifiers. Nurse is located at [facility] and pt is located at home.  I explained I am completing New OB Intake today. We discussed her EDD of 03/04/2024 that is based on LMP of 05/29/2023. Pt is G6/P3114. I reviewed her allergies, medications, Medical/Surgical/OB history, and appropriate screenings. There are no cats in the home.   Based on history, this is a/an pregnancy uncomplicated . Her obstetrical history is significant for obesity, pre-eclampsia, and diabetes (lost her monitor, currently doesn't have money for BP med or PNV) .  Patient Active Problem List   Diagnosis Date Noted   Supervision of other normal pregnancy, antepartum 08/06/2023   History of substance use disorder 04/29/2018   Anxiety and depression 04/29/2018   Chronic hypertension 03/25/2018    Concerns addressed today None  Delivery Plans:  Plans to deliver at [facility].  Anatomy US Explained first scheduled [place] will be done 12/11th and an anatomy scan will be done at 20 weeks.  Labs Discussed genetic screening with patient. Patient desires genetic testing to be drawn at new OB visit. Discussed possible labs to be drawn at new OB appointment.  COVID Vaccine Patient has not had COVID vaccine.   Social Determinants of Health Food Insecurity: expresses food insecurity. Information given on local food banks. WIC Referral: Patient is interested in referral to [facility].  Transportation: Patient expressed transportation needs. Childcare: Discussed no children allowed at ultrasound appointments.   First visit review I reviewed new OB appt with pt. I explained she will have ob bloodwork and pap smear/pelvic exam if indicated. Explained pt will be seen by an AOB Provider at first visit; encounter routed to appropriate provider.   Loran Senters,  [facility] 08/06/2023  10:42 AM

## 2023-08-06 NOTE — Patient Instructions (Signed)
Second Trimester of Pregnancy  The second trimester of pregnancy is from week 13 through week 27. This is months 4 through 6 of pregnancy. The second trimester is often a time when you feel your best. Your body has adjusted to being pregnant, and you begin to feel better physically. During the second trimester: Morning sickness has lessened or stopped completely. You may have more energy. You may have an increase in appetite. The second trimester is also a time when the unborn baby (fetus) is growing rapidly. At the end of the sixth month, the fetus may be up to 12 inches long and weigh about 1 pounds. You will likely begin to feel the baby move (quickening) between 16 and 20 weeks of pregnancy. Body changes during your second trimester Your body continues to go through many changes during your second trimester. The changes vary and generally return to normal after the baby is born. Physical changes Your weight will continue to increase. You will notice your lower abdomen bulging out. You may begin to get stretch marks on your hips, abdomen, and breasts. Your breasts will continue to grow and to become tender. Dark spots or blotches (chloasma or mask of pregnancy) may develop on your face. A dark line from your belly button to the pubic area (linea nigra) may appear. You may have changes in your hair. These can include thickening of your hair, rapid growth, and changes in texture. Some people also have hair loss during or after pregnancy, or hair that feels dry or thin. Health changes You may develop headaches. You may have heartburn. You may develop constipation. You may develop hemorrhoids or swollen, bulging veins (varicose veins). Your gums may bleed and may be sensitive to brushing and flossing. You may urinate more often because the fetus is pressing on your bladder. You may have back pain. This is caused by: Weight gain. Pregnancy hormones that are relaxing the joints in your  pelvis. A shift in weight and the muscles that support your balance. Follow these instructions at home: Medicines Follow your health care provider's instructions regarding medicine use. Specific medicines may be either safe or unsafe to take during pregnancy. Do not take any medicines unless approved by your health care provider. Take a prenatal vitamin that contains at least 600 micrograms (mcg) of folic acid. Eating and drinking Eat a healthy diet that includes fresh fruits and vegetables, whole grains, good sources of protein such as meat, eggs, or tofu, and low-fat dairy products. Avoid raw meat and unpasteurized juice, milk, and cheese. These carry germs that can harm you and your baby. You may need to take these actions to prevent or treat constipation: Drink enough fluid to keep your urine pale yellow. Eat foods that are high in fiber, such as beans, whole grains, and fresh fruits and vegetables. Limit foods that are high in fat and processed sugars, such as fried or sweet foods. Activity Exercise only as directed by your health care provider. Most people can continue their usual exercise routine during pregnancy. Try to exercise for 30 minutes at least 5 days a week. Stop exercising if you develop contractions in your uterus. Stop exercising if you develop pain or cramping in the lower abdomen or lower back. Avoid exercising if it is very hot or humid or if you are at a high altitude. Avoid heavy lifting. If you choose to, you may have sex unless your health care provider tells you not to. Relieving pain and discomfort Wear a supportive   bra to prevent discomfort from breast tenderness. Take warm sitz baths to soothe any pain or discomfort caused by hemorrhoids. Use hemorrhoid cream if your health care provider approves. Rest with your legs raised (elevated) if you have leg cramps or low back pain. If you develop varicose veins: Wear support hose as told by your health care  provider. Elevate your feet for 15 minutes, 3-4 times a day. Limit salt in your diet. Safety Wear your seat belt at all times when driving or riding in a car. Talk with your health care provider if someone is verbally or physically abusive to you. Lifestyle Do not use hot tubs, steam rooms, or saunas. Do not douche. Do not use tampons or scented sanitary pads. Avoid cat litter boxes and soil used by cats. These carry germs that can cause birth defects in the baby and possibly loss of the fetus by miscarriage or stillbirth. Do not use herbal remedies, alcohol, illegal drugs, or medicines that are not approved by your health care provider. Chemicals in these products can harm your baby. Do not use any products that contain nicotine or tobacco, such as cigarettes, e-cigarettes, and chewing tobacco. If you need help quitting, ask your health care provider. General instructions During a routine prenatal visit, your health care provider will do a physical exam and other tests. He or she will also discuss your overall health. Keep all follow-up visits. This is important. Ask your health care provider for a referral to a local prenatal education class. Ask for help if you have counseling or nutritional needs during pregnancy. Your health care provider can offer advice or refer you to specialists for help with various needs. Where to find more information American Pregnancy Association: americanpregnancy.org American College of Obstetricians and Gynecologists: acog.org/en/Womens%20Health/Pregnancy Office on Women's Health: womenshealth.gov/pregnancy Contact a health care provider if you have: A headache that does not go away when you take medicine. Vision changes or you see spots in front of your eyes. Mild pelvic cramps, pelvic pressure, or nagging pain in the abdominal area. Persistent nausea, vomiting, or diarrhea. A bad-smelling vaginal discharge or foul-smelling urine. Pain when you  urinate. Sudden or extreme swelling of your face, hands, ankles, feet, or legs. A fever. Get help right away if you: Have fluid leaking from your vagina. Have spotting or bleeding from your vagina. Have severe abdominal cramping or pain. Have difficulty breathing. Have chest pain. Have fainting spells. Have not felt your baby move for the time period told by your health care provider. Have new or increased pain, swelling, or redness in an arm or leg. Summary The second trimester of pregnancy is from week 13 through week 27 (months 4 through 6). Do not use herbal remedies, alcohol, illegal drugs, or medicines that are not approved by your health care provider. Chemicals in these products can harm your baby. Exercise only as directed by your health care provider. Most people can continue their usual exercise routine during pregnancy. Keep all follow-up visits. This is important. This information is not intended to replace advice given to you by your health care provider. Make sure you discuss any questions you have with your health care provider. Document Revised: 01/26/2020 Document Reviewed: 12/02/2019 Elsevier Patient Education  2024 [employer] First Trimester of Pregnancy  The first trimester of pregnancy starts on the first day of your last menstrual period until the end of week 12. This is also called months 1 through 3 of pregnancy. Body changes during your first trimester Your   body goes through many changes during pregnancy. The changes usually return to normal after your baby is born. Physical changes You may gain or lose weight. Your breasts may grow larger and hurt. The area around your nipples may get darker. Dark spots or blotches may develop on your face. You may have changes in your hair. Health changes You may feel like you might vomit (nauseous), and you may vomit. You may have heartburn. You may have headaches. You may have trouble pooping (constipation). Your  gums may bleed. Other changes You may get tired easily. You may pee (urinate) more often. Your menstrual periods will stop. You may not feel hungry. You may want to eat certain kinds of food. You may have changes in your emotions from day to day. You may have more dreams. Follow these instructions at home: Medicines Take over-the-counter and prescription medicines only as told by your doctor. Some medicines are not safe during pregnancy. Take a prenatal vitamin that contains at least 600 micrograms (mcg) of folic acid. Eating and drinking Eat healthy meals that include: Fresh fruits and vegetables. Whole grains. Good sources of protein, such as meat, eggs, or tofu. Low-fat dairy products. Avoid raw meat and unpasteurized juice, milk, and cheese. If you feel like you may vomit, or you vomit: Eat 4 or 5 small meals a day instead of 3 large meals. Try eating a few soda crackers. Drink liquids between meals instead of during meals. You may need to take these actions to prevent or treat trouble pooping: Drink enough fluids to keep your pee (urine) pale yellow. Eat foods that are high in fiber. These include beans, whole grains, and fresh fruits and vegetables. Limit foods that are high in fat and sugar. These include fried or sweet foods. Activity Exercise only as told by your doctor. Most people can do their usual exercise routine during pregnancy. Stop exercising if you have cramps or pain in your lower belly (abdomen) or low back. Do not exercise if it is too hot or too humid, or if you are in a place of great height (high altitude). Avoid heavy lifting. If you choose to, you may have sex unless your doctor tells you not to. Relieving pain and discomfort Wear a good support bra if your breasts are sore. Rest with your legs raised (elevated) if you have leg cramps or low back pain. If you have bulging veins (varicose veins) in your legs: Wear support hose as told by your  doctor. Raise your feet for 15 minutes, 3-4 times a day. Limit salt in your food. Safety Wear your seat belt at all times when you are in a car. Talk with your doctor if someone is hurting you or yelling at you. Talk with your doctor if you are feeling sad or have thoughts of hurting yourself. Lifestyle Do not use hot tubs, steam rooms, or saunas. Do not douche. Do not use tampons or scented sanitary pads. Do not use herbal medicines, illegal drugs, or medicines that are not approved by your doctor. Do not drink alcohol. Do not smoke or use any products that contain nicotine or tobacco. If you need help quitting, ask your doctor. Avoid cat litter boxes and soil that is used by cats. These carry germs that can cause harm to the baby and can cause a loss of your baby by miscarriage or stillbirth. General instructions Keep all follow-up visits. This is important. Ask for help if you need counseling or if you need help with   nutrition. Your doctor can give you advice or tell you where to go for help. Visit your dentist. At home, brush your teeth with a soft toothbrush. Floss gently. Write down your questions. Take them to your prenatal visits. Where to find more information American Pregnancy Association: americanpregnancy.org American College of Obstetricians and Gynecologists: www.acog.org Office on Women's Health: womenshealth.gov/pregnancy Contact a doctor if: You are dizzy. You have a fever. You have mild cramps or pressure in your lower belly. You have a nagging pain in your belly area. You continue to feel like you may vomit, you vomit, or you have watery poop (diarrhea) for 24 hours or longer. You have a bad-smelling fluid coming from your vagina. You have pain when you pee. You are exposed to a disease that spreads from person to person, such as chickenpox, measles, Zika virus, HIV, or hepatitis. Get help right away if: You have spotting or bleeding from your vagina. You have  very bad belly cramping or pain. You have shortness of breath or chest pain. You have any kind of injury, such as from a fall or a car crash. You have new or increased pain, swelling, or redness in an arm or leg. Summary The first trimester of pregnancy starts on the first day of your last menstrual period until the end of week 12 (months 1 through 3). Eat 4 or 5 small meals a day instead of 3 large meals. Do not smoke or use any products that contain nicotine or tobacco. If you need help quitting, ask your doctor. Keep all follow-up visits. This information is not intended to replace advice given to you by your health care provider. Make sure you discuss any questions you have with your health care provider. Document Revised: 01/26/2020 Document Reviewed: 12/02/2019 Elsevier Patient Education  2024 [employer] Commonly Asked Questions During Pregnancy  Cats: A parasite can be excreted in cat feces.  To avoid exposure you need to have another person empty the little box.  If you must empty the litter box you will need to wear gloves.  Wash your hands after handling your cat.  This parasite can also be found in raw or undercooked meat so this should also be avoided.  Colds, Sore Throats, Flu: Please check your medication sheet to see what you can take for symptoms.  If your symptoms are unrelieved by these medications please call the office.  Dental Work: Most any dental work your dentist recommends is permitted.  X-rays should only be taken during the first trimester if absolutely necessary.  Your abdomen should be shielded with a lead apron during all x-rays.  Please notify your provider prior to receiving any x-rays.  Novocaine is fine; gas is not recommended.  If your dentist requires a note from us prior to dental work please call the office and we will provide one for you.  Exercise: Exercise is an important part of staying healthy during your pregnancy.  You may continue most exercises  you were accustomed to prior to pregnancy.  Later in your pregnancy you will most likely notice you have difficulty with activities requiring balance like riding a bicycle.  It is important that you listen to your body and avoid activities that put you at a higher risk of falling.  Adequate rest and staying well hydrated are a must!  If you have questions about the safety of specific activities ask your provider.    Exposure to Children with illness: Try to avoid obvious exposure; report any   symptoms to us when noted,  If you have chicken pos, red measles or mumps, you should be immune to these diseases.   Please do not take any vaccines while pregnant unless you have checked with your OB provider.  Fetal Movement: After 28 weeks we recommend you do "kick counts" twice daily.  Lie or sit down in a calm quiet environment and count your baby movements "kicks".  You should feel your baby at least 10 times per hour.  If you have not felt 10 kicks within the first hour get up, walk around and have something sweet to eat or drink then repeat for an additional hour.  If count remains less than 10 per hour notify your provider.  Fumigating: Follow your pest control agent's advice as to how long to stay out of your home.  Ventilate the area well before re-entering.  Hemorrhoids:   Most over-the-counter preparations can be used during pregnancy.  Check your medication to see what is safe to use.  It is important to use a stool softener or fiber in your diet and to drink lots of liquids.  If hemorrhoids seem to be getting worse please call the office.   Hot Tubs:  Hot tubs Jacuzzis and saunas are not recommended while pregnant.  These increase your internal body temperature and should be avoided.  Intercourse:  Sexual intercourse is safe during pregnancy as long as you are comfortable, unless otherwise advised by your provider.  Spotting may occur after intercourse; report any bright red bleeding that is heavier  than spotting.  Labor:  If you know that you are in labor, please go to the hospital.  If you are unsure, please call the office and let us help you decide what to do.  Lifting, straining, etc:  If your job requires heavy lifting or straining please check with your provider for any limitations.  Generally, you should not lift items heavier than that you can lift simply with your hands and arms (no back muscles)  Painting:  Paint fumes do not harm your pregnancy, but may make you ill and should be avoided if possible.  Latex or water based paints have less odor than oils.  Use adequate ventilation while painting.  Permanents & Hair Color:  Chemicals in hair dyes are not recommended as they cause increase hair dryness which can increase hair loss during pregnancy.  " Highlighting" and permanents are allowed.  Dye may be absorbed differently and permanents may not hold as well during pregnancy.  Sunbathing:  Use a sunscreen, as skin burns easily during pregnancy.  Drink plenty of fluids; avoid over heating.  Tanning Beds:  Because their possible side effects are still unknown, tanning beds are not recommended.  Ultrasound Scans:  Routine ultrasounds are performed at approximately 20 weeks.  You will be able to see your baby's general anatomy an if you would like to know the gender this can usually be determined as well.  If it is questionable when you conceived you may also receive an ultrasound early in your pregnancy for dating purposes.  Otherwise ultrasound exams are not routinely performed unless there is a medical necessity.  Although you can request a scan we ask that you pay for it when conducted because insurance does not cover " patient request" scans.  Work: If your pregnancy proceeds without complications you may work until your due date, unless your physician or employer advises otherwise.  Round Ligament Pain/Pelvic Discomfort:  Sharp, shooting pains not associated   with bleeding are  fairly common, usually occurring in the second trimester of pregnancy.  They tend to be worse when standing up or when you remain standing for long periods of time.  These are the result of pressure of certain pelvic ligaments called "round ligaments".  Rest, Tylenol and heat seem to be the most effective relief.  As the womb and fetus grow, they rise out of the pelvis and the discomfort improves.  Please notify the office if your pain seems different than that described.  It may represent a more serious condition.  Common Medications Safe in Pregnancy  Acne:      Constipation:  Benzoyl Peroxide     Colace  Clindamycin      Dulcolax Suppository  Topica Erythromycin     Fibercon  Salicylic Acid      Metamucil         Miralax AVOID:        Senakot   Accutane    Cough:  Retin-A       Cough Drops  Tetracycline      Phenergan w/ Codeine if Rx  Minocycline      Robitussin (Plain & DM)  Antibiotics:     Crabs/Lice:  Ceclor       RID  Cephalosporins    AVOID:  E-Mycins      Kwell  Keflex  Macrobid/Macrodantin   Diarrhea:  Penicillin      Kao-Pectate  Zithromax      Imodium AD         PUSH FLUIDS AVOID:       Cipro     Fever:  Tetracycline      Tylenol (Regular or Extra  Minocycline       Strength)  Levaquin      Extra Strength-Do not          Exceed 8 tabs/24 hrs Caffeine:        <200mg/day (equiv. To 1 cup of coffee or  approx. 3 12 oz sodas)         Gas: Cold/Hayfever:       Gas-X  Benadryl      Mylicon  Claritin       Phazyme  **Claritin-D        Chlor-Trimeton    Headaches:  Dimetapp      ASA-Free Excedrin  Drixoral-Non-Drowsy     Cold Compress  Mucinex (Guaifenasin)     Tylenol (Regular or Extra  Sudafed/Sudafed-12 Hour     Strength)  **Sudafed PE Pseudoephedrine   Tylenol Cold & Sinus     Vicks Vapor Rub  Zyrtec  **AVOID if Problems With Blood Pressure         Heartburn: Avoid lying down for at least 1 hour after meals  Aciphex      Maalox     Rash:  Milk of  Magnesia     Benadryl    Mylanta       1% Hydrocortisone Cream  Pepcid  Pepcid Complete   Sleep Aids:  Prevacid      Ambien   Prilosec       Benadryl  Rolaids       Chamomile Tea  Tums (Limit 4/day)     Unisom         Tylenol PM         Warm milk-add vanilla or  Hemorrhoids:       Sugar for taste  Anusol/Anusol H.C.  (RX: Analapram 2.5%)  Sugar Substitutes:    Hydrocortisone OTC     Ok in moderation  Preparation H      Tucks        Vaseline lotion applied to tissue with wiping    Herpes:     Throat:  Acyclovir      Oragel  Famvir  Valtrex     Vaccines:         Flu Shot Leg Cramps:       *Gardasil  Benadryl      Hepatitis A         Hepatitis B Nasal Spray:       Pneumovax  Saline Nasal Spray     Polio Booster         Tetanus Nausea:       Tuberculosis test or PPD  Vitamin B6 25 mg TID   AVOID:    Dramamine      *Gardasil  Emetrol       Live Poliovirus  Ginger Root 250 mg QID    MMR (measles, mumps &  High Complex Carbs @ Bedtime    rebella)  Sea Bands-Accupressure    Varicella (Chickenpox)  Unisom 1/2 tab TID     *No known complications           If received before Pain:         Known pregnancy;   Darvocet       Resume series after  Lortab        Delivery  Percocet    Yeast:   Tramadol      Femstat  Tylenol 3      Gyne-lotrimin  Ultram       Monistat  Vicodin           MISC:         All Sunscreens           Hair Coloring/highlights          Insect Repellant's          (Including DEET)         Mystic Tans  

## 2023-08-13 ENCOUNTER — Telehealth: Payer: Self-pay | Admitting: Obstetrics

## 2023-08-13 ENCOUNTER — Other Ambulatory Visit: Payer: Self-pay

## 2023-08-13 NOTE — Telephone Encounter (Signed)
Reached out to pt about dating scan that was scheduled on 08/13/2023 at 11:15.  Gave the pt number for C. Scheduling since we don't have availability here.  I scheduled the pt for her NOB appt.

## 2023-09-01 ENCOUNTER — Telehealth: Payer: Self-pay | Admitting: Obstetrics

## 2023-09-01 ENCOUNTER — Encounter: Payer: Self-pay | Admitting: Obstetrics

## 2023-09-01 NOTE — Progress Notes (Deleted)
OBSTETRIC INITIAL PRENATAL VISIT  Subjective:    Robin [name] is being seen today for her first obstetrical visit.  This {is/is not:9024} a planned pregnancy. She is a 32 y.o. [id number] female at [redacted]w[redacted]d gestation, Estimated Date of Delivery: 03/04/24 with Patient's last menstrual period was 05/29/2023 (exact date).,  consistent with *** week sono. Her obstetrical history is significant for {ob risk factors:10154}. Relationship with FOB: {fob:16621}. Patient {does/does not:19097} intend to breast feed. Pregnancy history fully reviewed.    OB History  Gravida Para Term Preterm AB Living  6 4 3 1 1 4   SAB IAB Ectopic Multiple Live Births  1 0 0 0 4    # Outcome Date GA Lbr [name]/2nd Weight Sex Type Anes PTL Lv  6 Current           5 Term 07/27/20 [redacted]w[redacted]d  6 lb 14 oz (3.118 kg) M Vag-Forceps  N LIV     Complications: Preeclampsia  4 Term 04/30/18 [redacted]w[redacted]d  5 lb 15 oz (2.693 kg) F Vag-Spont  N LIV  3 Preterm 04/19/16 [redacted]w[redacted]d 02:41 / 00:09 3 lb 0.3 oz (1.37 kg) F Vag-Spont EPI  LIV     Complications: Preeclampsia     Name: Yiu,GIRL Elga     Apgar1: 6  Apgar5: 7  2 SAB 06/27/15          1 Term 08/10/07 [redacted]w[redacted]d  5 lb 6 oz (2.438 kg) F Vag-Spont EPI N LIV    Gynecologic History:  Last pap smear was ***.  Results were {Normal/Abnormal:304960160}.  {Actions; denies-reports:120008} h/o abnormal pap smears in the past.  {Actions; denies-reports:120008} history of STIs.  Contraception prior to conception:    Past Medical History:  Diagnosis Date   Anemia    4th pregnancy   Asthma    No Inhaler   Chronic hypertension affecting pregnancy 05/05/2020   Depression    Hx of Depression, refused medication.   History of gestational hypertension 12/10/2017   History of urinary tract infection    Indication for care in labor and delivery, antepartum 07/26/2020   IUD (intrauterine device) in place 07/27/2020   liletta placed post placentally 07/27/20  [ ]  string check at pp visit     Obesity  affecting pregnancy 11/11/2017   New OB visit on 03/16/20- BMI is 62.  Needs MFM consult and transfer to [facility] for care.     Preeclampsia    Pregnancy induced hypertension    With all previous pregnancies   Spontaneous abortion 06/2015   Supervision of high risk pregnancy, antepartum 03/16/2020            Clinic         Prenatal Labs ordered 7/15      Dating     ordered 03/16/20    Blood type:   A+      Genetic Screen    1 Screen:    AFP:     Quad:     NIPS:    Antibody: negative      Anatomic [place]    Normal female    Rubella:  immune      GTT    Early:               Third trimester: 11/9 drawn late    RPR:   NR      Flu vaccine         HBsAg:   negative      TDaP vaccine  Vaginal delivery 07/27/2020    Family History  Problem Relation Age of Onset   Hypertension Mother    Heart disease Father    Healthy Brother    Diabetes Maternal Grandmother    Healthy Maternal Grandfather    Cancer Paternal Grandmother 46       colon   Healthy Paternal Grandfather    Cancer Maternal Aunt 19       breast   Healthy Sister    Healthy Sister     Past Surgical History:  Procedure Laterality Date   WISDOM TOOTH EXTRACTION Left 2021   one    Social History   Socioeconomic History   Marital status: Significant Other    Spouse name: Not on file   Number of children: 4   Years of education: 12   Highest education level: Not on file  Occupational History   Occupation: unemployed    Comment: usually CNA work  Tobacco Use   Smoking status: Former    Current packs/day: 0.50    Average packs/day: 0.5 packs/day for 12.0 years (6.0 ttl pk-yrs)    Types: Cigarettes   Smokeless tobacco: Never   Tobacco comments:    Quit 8wks ago  Vaping Use   Vaping status: Never Used  Substance and Sexual Activity   Alcohol use: Not Currently    Comment: occasionally not since pregnancy   Drug use: Not Currently    Types: Marijuana   Sexual activity: Yes    Partners: Male    Birth  control/protection: None    Comment: Last BCM was Depo ~ 2 years ago.  Other Topics Concern   Not on file  Social History Narrative   Not on file   Social Drivers of Health   Financial Resource Strain: High Risk (08/06/2023)   Overall Financial Resource Strain (CARDIA)    Difficulty of Paying Living Expenses: Very hard  Food Insecurity: Food Insecurity Present (08/06/2023)   Hunger Vital Sign    Worried About Running Out of Food in the Last Year: Often true    Ran Out of Food in the Last Year: Often true  Transportation Needs: Unmet Transportation Needs (08/06/2023)   PRAPARE - Administrator, Civil Service (Medical): Yes    Lack of Transportation (Non-Medical): Yes  Physical Activity: Inactive (08/06/2023)   Exercise Vital Sign    Days of Exercise per Week: 0 days    Minutes of Exercise per Session: 0 min  Stress: Stress Concern Present (08/06/2023)   [employer] of Occupational Health - Occupational Stress Questionnaire    Feeling of Stress : To some extent  Social Connections: Unknown (08/06/2023)   Social Connection and Isolation Panel [NHANES]    Frequency of Communication with Friends and Family: Once a week    Frequency of Social Gatherings with Friends and Family: Not on file    Attends Religious Services: Never    Database administrator or Organizations: No    Attends Banker Meetings: Never    Marital Status: Not on file  Intimate Partner Violence: Not At Risk (08/06/2023)   Humiliation, Afraid, Rape, and Kick questionnaire    Fear of Current or Ex-Partner: No    Emotionally Abused: No    Physically Abused: No    Sexually Abused: No    Current Outpatient Medications on File Prior to Visit  Medication Sig Dispense Refill   Accu-Chek Softclix Lancets lancets 1 each by Other route 4 (four) times daily. Use as  instructed (Patient not taking: Reported on 08/06/2023) 100 each 12   acetaminophen (TYLENOL) 325 MG tablet Take 2 tablets (650 mg  total) by mouth every 6 (six) hours as needed (for pain scale < 4).     amLODipine (NORVASC) 5 MG tablet Take 1 tablet (5 mg total) by mouth daily. (Patient not taking: Reported on 10/26/2020) 30 tablet 0   Blood Glucose Monitoring Suppl (ACCU-CHEK GUIDE) w/Device KIT 1 kit by Does not apply route as directed. (Patient not taking: Reported on 08/06/2023) 1 kit 0   glucose blood (ACCU-CHEK GUIDE) test strip Use as instructed (Patient not taking: Reported on 08/06/2023) 100 each 12   prenatal vitamin w/FE, FA (NATACHEW) 29-1 MG CHEW chewable tablet Chew 1 tablet by mouth daily at 12 noon. (Patient not taking: Reported on 08/06/2023) 30 tablet 8   No current facility-administered medications on file prior to visit.    No Known Allergies   Review of Systems General: Not Present- Fever, Weight Loss and Weight Gain. Skin: Not Present- Rash. HEENT: Not Present- Blurred Vision, Headache and Bleeding Gums. Respiratory: Not Present- Difficulty Breathing. Breast: Not Present- Breast Mass. Cardiovascular: Not Present- Chest Pain, Elevated Blood Pressure, Fainting / Blacking Out and Shortness of Breath. Gastrointestinal: Not Present- Abdominal Pain, Constipation, Nausea and Vomiting. Female Genitourinary: Not Present- Frequency, Painful Urination, Pelvic Pain, Vaginal Bleeding, Vaginal Discharge, Contractions, regular, Fetal Movements Decreased, Urinary Complaints and Vaginal Fluid. Musculoskeletal: Not Present- Back Pain and Leg Cramps. Neurological: Not Present- Dizziness. Psychiatric: Not Present- Depression.     Objective:   Last menstrual period 05/29/2023.  There is no height or weight on file to calculate BMI.  General Appearance:    Alert, cooperative, no distress, appears stated age  Head:    Normocephalic, without obvious abnormality, atraumatic  Eyes:    PERRL, conjunctiva/corneas clear, EOM's intact, both eyes  Ears:    Normal external ear canals, both ears  Nose:   Nares normal, septum  midline, mucosa normal, no drainage or sinus tenderness  Throat:   Lips, mucosa, and tongue normal; teeth and gums normal  Neck:   Supple, symmetrical, trachea midline, no adenopathy; thyroid: no enlargement/tenderness/nodules; no carotid bruit or JVD  Back:     Symmetric, no curvature, ROM normal, no CVA tenderness  Lungs:     Clear to auscultation bilaterally, respirations unlabored  Chest Wall:    No tenderness or deformity   Heart:    Regular rate and rhythm, S1 and S2 normal, no murmur, rub or gallop  Breast Exam:    No tenderness, masses, or nipple abnormality  Abdomen:     Soft, non-tender, bowel sounds active all four quadrants, no masses, no organomegaly.  FHT ***  bpm.  Genitalia:    Pelvic:external genitalia normal, vagina without lesions, discharge, or tenderness, rectovaginal septum  normal. Cervix normal in appearance, no cervical motion tenderness, no adnexal masses or tenderness.  Pregnancy positive findings: uterine enlargement: *** wk size, nontender.   Rectal:    Normal external sphincter.  No hemorrhoids appreciated. Internal exam not done.   Extremities:   Extremities normal, atraumatic, no cyanosis or edema  Pulses:   2+ and symmetric all extremities  Skin:   Skin color, texture, turgor normal, no rashes or lesions  Lymph nodes:   Cervical, supraclavicular, and axillary nodes normal  Neurologic:   CNII-XII intact, normal strength, sensation and reflexes throughout     Assessment:   No diagnosis found.  Plan:   Supervision of ***normal/high risk pregnancy  -  Initial labs reviewed. - Prenatal vitamins encouraged. - Problem list reviewed and updated. - New OB counseling:  The patient has been given an overview regarding routine prenatal care.  Recommendations regarding diet, weight gain, and exercise in pregnancy were given. - Prenatal testing, optional genetic testing, and ultrasound use in pregnancy were reviewed.  Traditional genetic screening vs cell-fee DNA  genetic screening discussed, including risks and benefits. Testing {requests/ordered/declines:14581}. - Benefits of Breast Feeding were discussed. The patient is encouraged to consider nursing her baby post partum.  There are no diagnoses linked to this encounter.    Follow up in 4 weeks.    Julieanne Manson, DO [facility]

## 2023-09-01 NOTE — Telephone Encounter (Signed)
Reached out to pt to reschedule NOB appt that was scheduled on 09/01/2023 at 3:15 with Dr. Lonny Prude.  Left message for pt to call back to reschedule.

## 2023-09-02 ENCOUNTER — Encounter: Payer: Self-pay | Admitting: Obstetrics

## 2023-09-02 NOTE — Telephone Encounter (Signed)
Reached out to pt (2x) to reschedule NOB appt that was scheduled on 09/01/2023 at 3:15 with Dr. Lonny Prude.  Left message for pt to call back to reschedule.  Will send a Mychart letter to pt.

## 2023-09-04 ENCOUNTER — Encounter: Payer: Self-pay | Admitting: Obstetrics

## 2023-10-06 NOTE — Addendum Note (Signed)
Addended by: Kathlene Cote on: 10/06/2023 04:16 PM   Modules accepted: Orders

## 2023-10-13 ENCOUNTER — Ambulatory Visit: Admission: RE | Admit: 2023-10-13 | Payer: Self-pay | Source: Ambulatory Visit

## 2023-10-19 ENCOUNTER — Other Ambulatory Visit: Payer: Self-pay

## 2023-10-19 ENCOUNTER — Observation Stay: Payer: Self-pay

## 2023-10-19 ENCOUNTER — Observation Stay
Admission: EM | Admit: 2023-10-19 | Discharge: 2023-10-19 | Disposition: A | Discharge status: Home / Self Care | Payer: Self-pay | Attending: Obstetrics | Admitting: Obstetrics

## 2023-10-19 DIAGNOSIS — R109 Unspecified abdominal pain: Secondary | ICD-10-CM

## 2023-10-19 DIAGNOSIS — O26892 Other specified pregnancy related conditions, second trimester: Principal | ICD-10-CM

## 2023-10-19 DIAGNOSIS — Z3A20 20 weeks gestation of pregnancy: Secondary | ICD-10-CM | POA: Diagnosis not present

## 2023-10-19 DIAGNOSIS — Z3480 Encounter for supervision of other normal pregnancy, unspecified trimester: Principal | ICD-10-CM

## 2023-10-19 LAB — CBC
HCT: 32.2 % — ABNORMAL LOW (ref 36.0–46.0)
Hemoglobin: 10.8 g/dL — ABNORMAL LOW (ref 12.0–15.0)
MCH: 28 pg (ref 26.0–34.0)
MCHC: 33.5 g/dL (ref 30.0–36.0)
MCV: 83.4 fL (ref 80.0–100.0)
Platelets: 301 10*3/uL (ref 150–400)
RBC: 3.86 MIL/uL — ABNORMAL LOW (ref 3.87–5.11)
RDW: 13.5 % (ref 11.5–15.5)
WBC: 12.8 10*3/uL — ABNORMAL HIGH (ref 4.0–10.5)
nRBC: 0 % (ref 0.0–0.2)

## 2023-10-19 LAB — URINALYSIS, COMPLETE (UACMP) WITH MICROSCOPIC
Bilirubin Urine: NEGATIVE
Glucose, UA: NEGATIVE mg/dL
Ketones, ur: NEGATIVE mg/dL
Nitrite: NEGATIVE
Protein, ur: 30 mg/dL — AB
Specific Gravity, Urine: 1.03 (ref 1.005–1.030)
pH: 5 (ref 5.0–8.0)

## 2023-10-19 LAB — COMPREHENSIVE METABOLIC PANEL
ALT: 13 U/L (ref 0–44)
AST: 16 U/L (ref 15–41)
Albumin: 3 g/dL — ABNORMAL LOW (ref 3.5–5.0)
Alkaline Phosphatase: 60 U/L (ref 38–126)
Anion gap: 9 (ref 5–15)
BUN: 13 mg/dL (ref 6–20)
CO2: 22 mmol/L (ref 22–32)
Calcium: 9.3 mg/dL (ref 8.9–10.3)
Chloride: 108 mmol/L (ref 98–111)
Creatinine, Ser: 0.79 mg/dL (ref 0.44–1.00)
GFR, Estimated: >60 mL/min (ref >60)
Glucose, Bld: 99 mg/dL (ref 70–99)
Potassium: 3.7 mmol/L (ref 3.5–5.1)
Sodium: 139 mmol/L (ref 135–145)
Total Bilirubin: 0.2 mg/dL (ref 0.0–1.2)
Total Protein: 6.1 g/dL — ABNORMAL LOW (ref 6.5–8.1)

## 2023-10-19 LAB — AMYLASE: Amylase: 81 U/L (ref 28–100)

## 2023-10-19 LAB — PROTEIN / CREATININE RATIO, URINE
Creatinine, Urine: 314 mg/dL
Protein Creatinine Ratio: 0.08 mg/mg{creat} (ref 0.00–0.15)
Total Protein, Urine: 25 mg/dL

## 2023-10-19 LAB — ABO/RH: ABO/RH(D): A POS

## 2023-10-19 LAB — LIPASE, BLOOD: Lipase: 35 U/L (ref 11–51)

## 2023-10-19 MED ORDER — ALUM & MAG HYDROXIDE-SIMETH 200-200-20 MG/5ML PO SUSP
30.0000 mL | ORAL | Status: DC | PRN
  Administered 2023-10-19: 30 mL via ORAL
  Filled 2023-10-19: qty 30

## 2023-10-19 MED ORDER — LACTATED RINGERS IV SOLN: 500.0000 mL | INTRAVENOUS | Status: DC | PRN

## 2023-10-19 MED ORDER — MORPHINE SULFATE (PF) 2 MG/ML IV SOLN: 2.0000 mg | Freq: Once | INTRAVENOUS | Status: DC

## 2023-10-19 MED ORDER — LACTATED RINGERS IV SOLN: INTRAVENOUS | Status: DC

## 2023-10-19 MED ORDER — ACETAMINOPHEN 325 MG PO TABS
650.0000 mg | ORAL_TABLET | ORAL | Status: DC | PRN
  Administered 2023-10-19: 650 mg via ORAL
  Filled 2023-10-19: qty 2

## 2023-10-19 MED ORDER — SOD CITRATE-CITRIC ACID 500-334 MG/5ML PO SOLN: 30.0000 mL | ORAL | Status: DC | PRN

## 2023-10-19 MED ORDER — HYDROXYZINE HCL 25 MG PO TABS: 50.0000 mg | ORAL_TABLET | Freq: Four times a day (QID) | ORAL | Status: DC | PRN

## 2023-10-19 MED ORDER — ONDANSETRON HCL 4 MG/2ML IJ SOLN: 4.0000 mg | INTRAMUSCULAR | Status: DC | PRN

## 2023-10-19 MED ORDER — FAMOTIDINE 20 MG PO TABS: 20.0000 mg | ORAL_TABLET | Freq: Two times a day (BID) | ORAL | 3 refills | Status: AC

## 2023-10-19 NOTE — OB Triage Note (Signed)
 LABOR & DELIVERY OB TRIAGE NOTE  SUBJECTIVE  HPI Robin [name] is a 33 y.o. [id number] at [redacted]w[redacted]d who presents to Labor & Delivery for abdominal pain. See previous note by Quitman Livings, CNM, for HPI.   Patient has remained afebrile with no additional N/V. She states that the abdominal pain has become less notable during her time in triage.   OB History     Gravida  6   Para  4   Term  3   Preterm  1   AB  1   Living  4      SAB  1   IAB  0   Ectopic  0   Multiple  0   Live Births  4           Scheduled Meds:   morphine injection  2 mg Intravenous Once   Continuous Infusions:  lactated ringers     lactated ringers 125 mL/hr at 10/19/23 0522   PRN Meds:.acetaminophen, alum & mag hydroxide-simeth, hydrOXYzine, lactated ringers, ondansetron, sodium citrate-citric acid  OBJECTIVE  BP (!) 102/37 (BP Location: Right Arm)   Pulse 75   Temp 98 F (36.7 C) (Oral)   Resp 18   LMP 05/29/2023 (Exact Date) Comment: IUD removed  General: A&O x4  Heart: normal heart rate Lungs: normal work of breathing Abdomen: soft, non-tender on discharge Cervical exam:   deferred  Fetal Wellbeing FHR by doppler appropriate for GA  ASSESSMENT Impression  1) Pregnancy at [id number], [redacted]w[redacted]d, Estimated Date of Delivery: 03/04/24 2) Reassuring maternal/fetal status 3) Ultrasound negative for gall bladder concerns. Patient unable to complete MRI due to claustrophobia. Offered anti-anxiolytic which patient declined.  Unable to definitely rule out appendicitis, but given resolving pain, afebrile, and no other ongoing symptoms, suspicion is low for this diagnosis.   PLAN 1) Reviewed with patient that we could not definitively rule out appendicitis but have low suspicion given resolving symptoms. Reviewed return precautions for appendicitis.  2) Discussed her pain could be gastric in origin. Recommended small, more frequent meals. Provided with Rx for famotidine.  3) Patient discharged in  stable condition with recommendation to continue with routine prenatal care. We discussed that given her BMI of >60, she does not meet anesthesia criteria to deliver at [facility] and will need to transfer her care to a provider that delivers at [facility] or another tertiary care facility. She expressed understanding and was given contact information for the [facility] practice at [facility].   Lindalou Hose Elleigh Cassetta, CNM  10/19/23  11:00 AM

## 2023-10-19 NOTE — OB Triage Note (Signed)
 Robin [name] 33 y.o. @G6P4  @20w3dGA   presents to Labor & Delivery triage via wheelchair steered by ED staff reporting sharp epigastric pain and headache . Marland Kitchen She denies signs and symptoms consistent with rupture of membranes or active vaginal bleeding. She denies contractions and states positive fetal movement. External FM and TOCO applied to non-tender abdomen Vital signs obtained and within normal limits. Patient oriented to care environment including call bell and bed control use. Missy Swanson, CNM notified of patient's arrival.

## 2023-10-19 NOTE — OB Triage Note (Signed)
 LABOR & DELIVERY OB TRIAGE NOTE  SUBJECTIVE  HPI Robin [name] is a 33 y.o. [id number] at [redacted]w[redacted]d who presents to Labor & Delivery for abdominal pain. She reports that around 2300 last night, she began having stabbing pain in her abdomen along with some cramping. The pain is  bilateral and moves from side to side. The pain improves a little when she lies on her side. She has vomited twice but is not currently nauseated. She has a headache. She has a h/o preeclampsia in a prior pregnancy. She is afebrile and denies diarrhea. She denies chest pain and SOB. She denies LOF, vaginal bleeding, and contractions. She endorses fetal movement.   OB History     Gravida  6   Para  4   Term  3   Preterm  1   AB  1   Living  4      SAB  1   IAB  0   Ectopic  0   Multiple  0   Live Births  4           Scheduled Meds: Continuous Infusions:  lactated ringers     lactated ringers     PRN Meds:.acetaminophen, alum & mag hydroxide-simeth, hydrOXYzine, lactated ringers, ondansetron, sodium citrate-citric acid  OBJECTIVE   General: alert, cooperative,, mild distress Heart: RRR Lungs: CTAB Abdomen: soft, gravid, diffusely tender to palpation, tender to mild palpation in epigastric area, right upper and lower quadrants, +guarding  FHT: 156  ASSESSMENT Impression  1) Pregnancy at [id number], [redacted]w[redacted]d, Estimated Date of Delivery: 03/04/24 2) Abdominal pain  PLAN 1) Labs: CBC, CMP, amylase, lipase 2) RUQ [place] for gallbladder. MRI to evaluate for appendicitis if [place] does not demonstrate gallbladder concerns 3) IV fluids, anti-emetics, and pain control Discussed plan with Dr. Valentino Saxon.  Robin [name], CNM 10/19/23  5:08 AM

## 2023-10-20 ENCOUNTER — Ambulatory Visit: Admission: RE | Admit: 2023-10-20 | Payer: Self-pay | Source: Ambulatory Visit

## 2023-10-20 LAB — URINE CULTURE

## 2023-10-21 ENCOUNTER — Encounter: Payer: Self-pay | Admitting: Certified Nurse Midwife

## 2023-10-24 ENCOUNTER — Other Ambulatory Visit: Payer: Self-pay

## 2023-10-24 ENCOUNTER — Emergency Department
Admission: EM | Admit: 2023-10-24 | Discharge: 2023-10-24 | Disposition: A | Discharge status: Home / Self Care | Payer: Self-pay | Attending: Emergency Medicine | Admitting: Emergency Medicine

## 2023-10-24 ENCOUNTER — Emergency Department: Payer: Self-pay

## 2023-10-24 DIAGNOSIS — R1011 Right upper quadrant pain: Secondary | ICD-10-CM | POA: Diagnosis not present

## 2023-10-24 DIAGNOSIS — Z3A22 22 weeks gestation of pregnancy: Secondary | ICD-10-CM | POA: Diagnosis not present

## 2023-10-24 DIAGNOSIS — O26892 Other specified pregnancy related conditions, second trimester: Secondary | ICD-10-CM | POA: Insufficient documentation

## 2023-10-24 LAB — CBC
HCT: 35.2 % — ABNORMAL LOW (ref 36.0–46.0)
Hemoglobin: 11.3 g/dL — ABNORMAL LOW (ref 12.0–15.0)
MCH: 27.3 pg (ref 26.0–34.0)
MCHC: 32.1 g/dL (ref 30.0–36.0)
MCV: 85 fL (ref 80.0–100.0)
Platelets: 316 10*3/uL (ref 150–400)
RBC: 4.14 MIL/uL (ref 3.87–5.11)
RDW: 13.5 % (ref 11.5–15.5)
WBC: 14.7 10*3/uL — ABNORMAL HIGH (ref 4.0–10.5)
nRBC: 0 % (ref 0.0–0.2)

## 2023-10-24 LAB — URINALYSIS, ROUTINE W REFLEX MICROSCOPIC
Bilirubin Urine: NEGATIVE
Glucose, UA: NEGATIVE mg/dL
Ketones, ur: NEGATIVE mg/dL
Nitrite: NEGATIVE
Protein, ur: 30 mg/dL — AB
Specific Gravity, Urine: 1.031 — ABNORMAL HIGH (ref 1.005–1.030)
pH: 5 (ref 5.0–8.0)

## 2023-10-24 LAB — COMPREHENSIVE METABOLIC PANEL
ALT: 17 U/L (ref 0–44)
AST: 22 U/L (ref 15–41)
Albumin: 3.2 g/dL — ABNORMAL LOW (ref 3.5–5.0)
Alkaline Phosphatase: 97 U/L (ref 38–126)
Anion gap: 11 (ref 5–15)
BUN: 10 mg/dL (ref 6–20)
CO2: 21 mmol/L — ABNORMAL LOW (ref 22–32)
Calcium: 8.8 mg/dL — ABNORMAL LOW (ref 8.9–10.3)
Chloride: 106 mmol/L (ref 98–111)
Creatinine, Ser: 0.62 mg/dL (ref 0.44–1.00)
GFR, Estimated: >60 mL/min (ref >60)
Glucose, Bld: 104 mg/dL — ABNORMAL HIGH (ref 70–99)
Potassium: 3.3 mmol/L — ABNORMAL LOW (ref 3.5–5.1)
Sodium: 138 mmol/L (ref 135–145)
Total Bilirubin: 0.3 mg/dL (ref 0.0–1.2)
Total Protein: 6.7 g/dL (ref 6.5–8.1)

## 2023-10-24 LAB — LIPASE, BLOOD: Lipase: 32 U/L (ref 11–51)

## 2023-10-24 MED ORDER — MORPHINE SULFATE (PF) 4 MG/ML IV SOLN
4.0000 mg | Freq: Once | INTRAVENOUS | Status: AC
  Administered 2023-10-24: 4 mg via INTRAVENOUS
  Filled 2023-10-24: qty 1

## 2023-10-24 MED ORDER — ONDANSETRON HCL 4 MG/2ML IJ SOLN
4.0000 mg | Freq: Once | INTRAMUSCULAR | Status: AC
  Administered 2023-10-24: 4 mg via INTRAVENOUS
  Filled 2023-10-24: qty 2

## 2023-10-24 NOTE — Discharge Instructions (Signed)
 Please follow-up with your doctor/OB/GYN regarding today's ER visit.  Return to the emergency department for any return of/worsening abdominal pain or any other symptom personally concerning to yourself.

## 2023-10-24 NOTE — ED Notes (Signed)
 Pt denies pain, given pillow and ice chips for comfort.

## 2023-10-24 NOTE — ED Notes (Signed)
 [place] at bedside

## 2023-10-24 NOTE — ED Triage Notes (Signed)
 Pt reports stabbing feeling from the side that radiates to RLQ. Pt reports she came for the same symptoms last week. Pt is [redacted] weeks pregnant. 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 Pt MD did not want to do surgery due to pregnancy. Pt reports she went to the restroom to urinate and started to feel severe pain to RLQ. Pt talks in complete sentences no respiratory distress noted.

## 2023-10-24 NOTE — ED Notes (Signed)
 Robin [name], daughter, [phone].

## 2023-10-24 NOTE — ED Provider Notes (Signed)
 [facility] Provider Note    Event Date/Time   First MD Initiated Contact with Patient 10/24/23 1932     (approximate)  History   Chief Complaint: Abdominal Pain  HPI  Robin [name] is a 33 y.o. female with a past medical history of anemia, depression, approximately [redacted] weeks pregnant who presents to the emergency department for right upper quadrant abdominal pain.  According to the patient for the past 2 to 3 days she has been experiencing intermittent sharp pains to the right upper quadrant of her abdomen.  Patient states the pain is worse after she eats.  No fever.  No nausea or vomiting.  No diarrhea.  Patient states she was seen for similar issues several days ago however she had an ultrasound performed of the baby but no other workup was done.  Regarding the triage note from the ruptured appendicitis patient states this was 1 to 2 years ago treated with outpatient antibiotics from [place] and she has not had any issues since.  Patient denies any lower abdominal pain denies any fever.  Physical Exam   Triage Vital Signs: ED Triage Vitals [10/24/23 1907]  Encounter Vitals Group     BP 138/81     Systolic BP Percentile      Diastolic BP Percentile      Pulse Rate 88     Resp 16     Temp 98.3 F (36.8 C)     Temp Source Oral     SpO2 97 %     Weight 270 lb (122.5 kg)     Height 5\' 1"  (1.549 m)     Head Circumference      Peak Flow      Pain Score 7     Pain Loc      Pain Education      Exclude from Growth Chart     Most recent vital signs: Vitals:   10/24/23 1907  BP: 138/81  Pulse: 88  Resp: 16  Temp: 98.3 F (36.8 C)  SpO2: 97%    General: Awake, no distress.  CV:  Good peripheral perfusion.  Regular rate and rhythm  Resp:  Normal effort.  Equal breath sounds bilaterally.  Abd:  Gravid abdomen, mild to moderate right upper quadrant tenderness to epigastric tenderness.  No lower abdominal tenderness to palpation.   ED  Results / Procedures / Treatments   RADIOLOGY  Patient's ultrasound is negative.   MEDICATIONS ORDERED IN ED: Medications - No data to display   IMPRESSION / MDM / ASSESSMENT AND PLAN / ED COURSE  I reviewed the triage vital signs and the nursing notes.  Patient's presentation is most consistent with acute presentation with potential threat to life or bodily function.  Patient resents to the emergency department for upper quadrant abdominal pain intermittent over the last 3 to 4 days.  She believes it is worse when she eats food.  No history of gallbladder disease per patient.  Patient denies any fever denies any nausea vomiting diarrhea.  Patient is approximate [redacted] weeks pregnant denies any vaginal bleeding discharge or fluid leakage.  Denies any lower abdominal pain or discomfort.  Describes the pain as sharp and intermittent when it does occur.  Mild to moderate right upper quadrant epigastric tenderness on my examination.  No lower abdominal tenderness.  Will proceed with a right upper quadrant ultrasound to further evaluate.  Lab work is reassuring with a slight leukocytosis 14,000, chemistry shows reassuring LFTs and  lipase.  Urinalysis shows no significant finding.  Patient now endorsing severe pain in the right upper quadrant.  I discussed with the patient pros and cons of pain medication as well as unsafe in pregnancy although a one-time dose is usually not a cause of any long-term issues.  Patient understands the risk and wishes to proceed with a one-time dose of pain medication.  Patient's ultrasound is negative, LFTs and lipase reassuring.  Patient could possibly be experiencing more gastritis type symptoms.  Patient states she is unable to get an MRI due to severe claustrophobia and is unwilling to do so.  Patient states her pain is completely gone.  Patient states she is feeling much better and is ready to go home.  Discussed with patient she needs to follow-up with her OB/GYN on  Monday to discuss her intermittent abdominal pains.  Discussed return precautions.  Patient agreeable to plan.  FINAL CLINICAL IMPRESSION(S) / ED DIAGNOSES   Right upper quadrant abdominal pain   Note:  This document was prepared using Dragon voice recognition software and may include unintentional dictation errors.   Minna Antis, MD 10/24/23 2225

## 2023-10-24 NOTE — ED Triage Notes (Signed)
 Pt come via EMS from home with c/o belly pain. Pt states she was here last week for belly pain and had punctured appendix. Pt doc didn't want to do surgery since pt is pregnant. Pt is [redacted] weeks pregnant.  Charge [facility] called upstairs and they wouldn't take pt per [employer].  VSS  Pt stated when she got up today to urinate it was painful.

## 2023-10-24 NOTE — ED Notes (Signed)
 Pt endorsing severe abd pain, MD notified and at bedside. See new orders.

## 2023-11-03 ENCOUNTER — Encounter: Payer: Self-pay | Admitting: Obstetrics and Gynecology

## 2023-11-04 NOTE — Progress Notes (Signed)
 Patient did not keep her new OB appointment for 11/03/2023.  Cornelia Copa MD Attending Center for [employer] [facility])

## 2024-01-15 ENCOUNTER — Encounter: Admitting: Physician Assistant

## 2024-01-19 ENCOUNTER — Encounter: Admitting: Obstetrics and Gynecology

## 2024-01-19 DIAGNOSIS — Z3A33 33 weeks gestation of pregnancy: Secondary | ICD-10-CM

## 2024-01-19 DIAGNOSIS — Z3480 Encounter for supervision of other normal pregnancy, unspecified trimester: Secondary | ICD-10-CM

## 2024-01-22 ENCOUNTER — Encounter: Payer: Self-pay | Admitting: Certified Nurse Midwife

## 2024-01-22 ENCOUNTER — Ambulatory Visit (INDEPENDENT_AMBULATORY_CARE_PROVIDER_SITE_OTHER): Admitting: Certified Nurse Midwife

## 2024-01-22 ENCOUNTER — Telehealth: Payer: Self-pay

## 2024-01-22 VITALS — BP 124/85 | HR 89 | Wt 325.0 lb

## 2024-01-22 DIAGNOSIS — Z3009 Encounter for other general counseling and advice on contraception: Secondary | ICD-10-CM

## 2024-01-22 DIAGNOSIS — Z8751 Personal history of pre-term labor: Secondary | ICD-10-CM | POA: Diagnosis not present

## 2024-01-22 DIAGNOSIS — Z8759 Personal history of other complications of pregnancy, childbirth and the puerperium: Secondary | ICD-10-CM

## 2024-01-22 DIAGNOSIS — O0930 Supervision of pregnancy with insufficient antenatal care, unspecified trimester: Secondary | ICD-10-CM

## 2024-01-22 DIAGNOSIS — I10 Essential (primary) hypertension: Secondary | ICD-10-CM

## 2024-01-22 DIAGNOSIS — O0990 Supervision of high risk pregnancy, unspecified, unspecified trimester: Secondary | ICD-10-CM

## 2024-01-22 DIAGNOSIS — O99320 Drug use complicating pregnancy, unspecified trimester: Secondary | ICD-10-CM | POA: Insufficient documentation

## 2024-01-22 DIAGNOSIS — O99323 Drug use complicating pregnancy, third trimester: Secondary | ICD-10-CM

## 2024-01-22 NOTE — Progress Notes (Addendum)
 NOB   T-dap offered: ? Pt has cold sx's today.  Patient ok with doing 1 hr GTT  Last pap: 03/16/20 WNL   CC: Congestion, pain on both sides so painful had to go to hospital.

## 2024-01-22 NOTE — Telephone Encounter (Signed)
 Pt advised that her BTL form will need to be refilled out due to an error. Pt given office hours and verbalized understanding.

## 2024-01-22 NOTE — Progress Notes (Signed)
 History:    Robin [name] is a 33 y.o. [id number] at [redacted]w[redacted]d by LMP being seen today for her first obstetrical visit.  Her obstetrical history is significant for obesity and substance use, preterm delivery, CHTN, late entry to care (this pregancy) . Patient does intend to breast feed. Pregnancy history fully reviewed.  Patient reports no complaints.      HISTORY: OB History  Gravida Para Term Preterm AB Living  6 4 3 1 1 4   SAB IAB Ectopic Multiple Live Births  1 0 0 0 4    # Outcome Date GA Lbr Len/2nd Weight Sex Type Anes PTL Lv  6 Current           5 Term 07/27/20 [redacted]w[redacted]d  6 lb 14 oz (3.118 kg) M Vag-Forceps  N LIV     Complications: Preeclampsia  4 Term 04/30/18 [redacted]w[redacted]d  5 lb 15 oz (2.693 kg) F Vag-Spont  N LIV  3 Preterm 04/19/16 [redacted]w[redacted]d 02:41 / 00:09 3 lb 0.3 oz (1.37 kg) F Vag-Spont EPI  LIV     Complications: Preeclampsia     Name: Galluzzo,GIRL Joyice     Apgar1: 6  Apgar5: 7  2 SAB 06/27/15          1 Term 08/10/07 [redacted]w[redacted]d  5 lb 6 oz (2.438 kg) F Vag-Spont EPI N LIV    Last pap smear was done 2021 and was normal  Past Medical History:  Diagnosis Date   Asthma    No Inhaler   Chronic hypertension affecting pregnancy 05/05/2020   Depression    Hx of Depression, refused medication.   History of urinary tract infection    Obesity affecting pregnancy 11/11/2017   New OB visit on 03/16/20- BMI is 62.  Needs MFM consult and transfer to [facility] for care.     Preeclampsia    Pregnancy induced hypertension    With all previous pregnancies   Past Surgical History:  Procedure Laterality Date   WISDOM TOOTH EXTRACTION Left 2021   one   Family History  Problem Relation Age of Onset   Hypertension Mother    Heart disease Father    Healthy Brother    Diabetes Maternal Grandmother    Healthy Maternal Grandfather    Cancer Paternal Grandmother 65       colon   Healthy Paternal Grandfather    Cancer Maternal Aunt 30       breast   Healthy Sister    Healthy Sister     Social History   Tobacco Use   Smoking status: Former    Current packs/day: 0.50    Average packs/day: 0.5 packs/day for 12.0 years (6.0 ttl pk-yrs)    Types: Cigarettes   Smokeless tobacco: Never   Tobacco comments:    Quit 8wks ago  Vaping Use   Vaping status: Never Used  Substance Use Topics   Alcohol use: Not Currently    Comment: occasionally not since pregnancy   Drug use: Not Currently    Types: Marijuana   No Known Allergies Current Outpatient Medications on File Prior to Visit  Medication Sig Dispense Refill   acetaminophen  (TYLENOL ) 325 MG tablet Take 2 tablets (650 mg total) by mouth every 6 (six) hours as needed (for pain scale < 4).     famotidine  (PEPCID ) 20 MG tablet Take 1 tablet (20 mg total) by mouth 2 (two) times daily. 60 tablet 3   Accu-Chek Softclix Lancets lancets 1 each by Other route 4 (  four) times daily. Use as instructed (Patient not taking: Reported on 01/22/2024) 100 each 12   amLODipine  (NORVASC ) 5 MG tablet Take 1 tablet (5 mg total) by mouth daily. (Patient not taking: Reported on 01/22/2024) 30 tablet 0   Blood Glucose Monitoring Suppl (ACCU-CHEK GUIDE) w/Device KIT 1 kit by Does not apply route as directed. (Patient not taking: Reported on 01/22/2024) 1 kit 0   glucose blood (ACCU-CHEK GUIDE) test strip Use as instructed (Patient not taking: Reported on 01/22/2024) 100 each 12   prenatal vitamin w/FE, FA (NATACHEW) 29-1 MG CHEW chewable tablet Chew 1 tablet by mouth daily at 12 noon. (Patient not taking: Reported on 01/22/2024) 30 tablet 8   No current facility-administered medications on file prior to visit.    Review of Systems Pertinent items noted in HPI and remainder of comprehensive ROS otherwise negative. Physical Exam:   Vitals:   01/22/24 0855  BP: 124/85  Pulse: 89  Weight: (!) 325 lb (147.4 kg)   Fetal Heart Rate (bpm): 135  Constitutional: Well-developed, well-nourished pregnant female in no acute distress.  HEENT:  PERRLA Skin: normal color and turgor, no rash Cardiovascular: normal rate & rhythm, warm and well perfused Respiratory: normal effort, no problems with respiration noted GI: Abd soft, non-distended MS: Extremities nontender, no edema, normal ROM Neurologic: Alert and oriented x 4.  GU: no CVA tenderness Pelvic: Exam deferred  Assessment:     Pregnancy: [id number] Patient Active Problem List   Diagnosis Date Noted   Drug use complicating pregnancy 01/22/2024   Supervision of high risk pregnancy, antepartum 08/06/2023   No prenatal care in current pregnancy 07/12/2020   History of substance use disorder 04/29/2018   Anxiety and depression 04/29/2018   Chronic hypertension 03/25/2018   History of pre-eclampsia 12/10/2017     Plan:    1. Supervision of high risk pregnancy, antepartum (Primary) - Doing well, feeling regular and vigorous fetal movement  - CBC/D/Plt+RPR+Rh+ABO+RubIgG... - TSH - US  MFM OB DETAIL +14 WK; Future - AMBULATORY REFERRAL TO [facility] - Comprehensive metabolic panel with GFR - Hemoglobin A1c - Protein / creatinine ratio, urine - Culture, OB Urine - Glucose, 1 hour  2. Limited prenatal care, antepartum - Collection labs and coordination of care today.  - US  MFM OB DETAIL +14 WK; Future - Amb ref to [facility])  3. History of preterm delivery - No s/s of labor.   4. History of pre-eclampsia - Baseline labs today. BP WNL today.   5. Chronic hypertension - BP WNL today.   6. Drug use affecting pregnancy in third trimester - Patient endorses use of cocaine and marijuana in the past year. Last use of cocaine was January 2025 and marijuana use every other day.  - ToxASSURE Select 13 (MW), Urine [facility]) - Amb ref to [facility])  7. Unwanted fertility - BTL papers signed today. Unsure of BTL vs. Depo or Nexplanon.  - Initial labs drawn. - Continue prenatal vitamins. - Problem list reviewed and  updated. - Genetic Screening discussed, Late entry to care: . - Ultrasound discussed; fetal anatomic survey: ordered. - Anticipatory guidance about prenatal visits given including labs, ultrasounds, and testing. - Discussed usage of Babyscripts and virtual visits as additional source of managing and completing prenatal visits in midst of coronavirus and pandemic.   - Encouraged to complete MyChart Registration for her ability to review results, send requests, and have questions addressed.  - The nature of [place] -  Center for [facility] Healthcare/Faculty Practice with multiple MDs and Advanced Practice Providers was explained to patient; also emphasized that residents, students are part of our team. - Routine obstetric precautions reviewed. Encouraged to seek out care at office or emergency room [facility] MAU preferred) for urgent and/or emergent concerns.  Return in about 2 weeks (around 02/05/2024) for HROB with GBS.    Future Appointments  Date Time Provider Department Center  02/04/2024  1:30 PM Raynell Caller, MD [facility]    Raford Bunk, MSN, CNM, RNC-OB Certified Nurse Midwife, [facility] 01/22/2024 10:23 AM

## 2024-01-23 ENCOUNTER — Ambulatory Visit: Payer: Self-pay | Admitting: Certified Nurse Midwife

## 2024-01-23 ENCOUNTER — Other Ambulatory Visit: Payer: Self-pay | Admitting: *Deleted

## 2024-01-23 DIAGNOSIS — O0990 Supervision of high risk pregnancy, unspecified, unspecified trimester: Secondary | ICD-10-CM

## 2024-01-23 DIAGNOSIS — O24410 Gestational diabetes mellitus in pregnancy, diet controlled: Secondary | ICD-10-CM

## 2024-01-23 LAB — PROTEIN / CREATININE RATIO, URINE
Creatinine, Urine: 157.2 mg/dL
Protein, Ur: 28.8 mg/dL
Protein/Creat Ratio: 183 mg/g{creat} (ref 0–200)

## 2024-01-23 LAB — COMPREHENSIVE METABOLIC PANEL WITH GFR
ALT: 7 IU/L (ref 0–32)
AST: 9 IU/L (ref 0–40)
Albumin: 3.6 g/dL — ABNORMAL LOW (ref 3.9–4.9)
Alkaline Phosphatase: 122 IU/L — ABNORMAL HIGH (ref 44–121)
BUN/Creatinine Ratio: 11 (ref 9–23)
BUN: 7 mg/dL (ref 6–20)
Bilirubin Total: <0.2 mg/dL (ref 0.0–1.2)
CO2: 16 mmol/L — ABNORMAL LOW (ref 20–29)
Calcium: 8.9 mg/dL (ref 8.7–10.2)
Chloride: 104 mmol/L (ref 96–106)
Creatinine, Ser: 0.66 mg/dL (ref 0.57–1.00)
Globulin, Total: 2.6 g/dL (ref 1.5–4.5)
Glucose: 195 mg/dL — ABNORMAL HIGH (ref 70–99)
Potassium: 4.3 mmol/L (ref 3.5–5.2)
Sodium: 134 mmol/L (ref 134–144)
Total Protein: 6.2 g/dL (ref 6.0–8.5)
eGFR: 119 mL/min/{1.73_m2} (ref >59)

## 2024-01-23 LAB — CBC/D/PLT+RPR+RH+ABO+RUBIGG...
Antibody Screen: NEGATIVE
Basophils Absolute: 0 10*3/uL (ref 0.0–0.2)
Basos: 0 %
EOS (ABSOLUTE): 0.1 10*3/uL (ref 0.0–0.4)
Eos: 1 %
HCV Ab: NONREACTIVE
HIV Screen 4th Generation wRfx: NONREACTIVE
Hematocrit: 37.2 % (ref 34.0–46.6)
Hemoglobin: 11.8 g/dL (ref 11.1–15.9)
Hepatitis B Surface Ag: NEGATIVE
Immature Grans (Abs): 0.1 10*3/uL (ref 0.0–0.1)
Immature Granulocytes: 1 %
Lymphocytes Absolute: 1.9 10*3/uL (ref 0.7–3.1)
Lymphs: 21 %
MCH: 27.8 pg (ref 26.6–33.0)
MCHC: 31.7 g/dL (ref 31.5–35.7)
MCV: 88 fL (ref 79–97)
Monocytes Absolute: 0.4 10*3/uL (ref 0.1–0.9)
Monocytes: 4 %
Neutrophils Absolute: 6.8 10*3/uL (ref 1.4–7.0)
Neutrophils: 73 %
Platelets: 324 10*3/uL (ref 150–450)
RBC: 4.25 x10E6/uL (ref 3.77–5.28)
RDW: 13.1 % (ref 11.7–15.4)
RPR Ser Ql: NONREACTIVE
Rh Factor: POSITIVE
Rubella Antibodies, IGG: 3.62 {index} (ref >0.99)
WBC: 9.2 10*3/uL (ref 3.4–10.8)

## 2024-01-23 LAB — HEMOGLOBIN A1C
Est. average glucose Bld gHb Est-mCnc: 117 mg/dL
Hgb A1c MFr Bld: 5.7 % — ABNORMAL HIGH (ref 4.8–5.6)

## 2024-01-23 LAB — GLUCOSE, 1 HOUR GESTATIONAL: Gestational Diabetes Screen: 185 mg/dL — ABNORMAL HIGH (ref 70–139)

## 2024-01-23 LAB — TSH: TSH: 1.82 u[IU]/mL (ref 0.450–4.500)

## 2024-01-23 LAB — HCV INTERPRETATION

## 2024-01-23 MED ORDER — ACCU-CHEK SOFTCLIX LANCETS MISC: 1.0000 | Freq: Four times a day (QID) | 12 refills | Status: AC

## 2024-01-23 MED ORDER — GLUCOSE BLOOD VI STRP: ORAL_STRIP | 12 refills | Status: AC

## 2024-01-23 MED ORDER — ACCU-CHEK GUIDE W/DEVICE KIT: 1.0000 | PACK | Freq: Four times a day (QID) | 0 refills | Status: AC

## 2024-01-24 LAB — CULTURE, OB URINE

## 2024-01-24 LAB — URINE CULTURE, OB REFLEX

## 2024-02-04 ENCOUNTER — Ambulatory Visit

## 2024-02-04 ENCOUNTER — Encounter: Payer: Self-pay | Admitting: Obstetrics and Gynecology

## 2024-02-04 ENCOUNTER — Telehealth (INDEPENDENT_AMBULATORY_CARE_PROVIDER_SITE_OTHER): Admitting: Obstetrics and Gynecology

## 2024-02-04 DIAGNOSIS — O0933 Supervision of pregnancy with insufficient antenatal care, third trimester: Secondary | ICD-10-CM | POA: Diagnosis not present

## 2024-02-04 DIAGNOSIS — O24410 Gestational diabetes mellitus in pregnancy, diet controlled: Secondary | ICD-10-CM

## 2024-02-04 DIAGNOSIS — Z87898 Personal history of other specified conditions: Secondary | ICD-10-CM

## 2024-02-04 DIAGNOSIS — Z6841 Body Mass Index (BMI) 40.0 and over, adult: Secondary | ICD-10-CM

## 2024-02-04 DIAGNOSIS — O0990 Supervision of high risk pregnancy, unspecified, unspecified trimester: Secondary | ICD-10-CM

## 2024-02-04 DIAGNOSIS — Z3A35 35 weeks gestation of pregnancy: Secondary | ICD-10-CM

## 2024-02-04 DIAGNOSIS — I10 Essential (primary) hypertension: Secondary | ICD-10-CM | POA: Diagnosis not present

## 2024-02-04 DIAGNOSIS — O0973 Supervision of high risk pregnancy due to social problems, third trimester: Secondary | ICD-10-CM | POA: Insufficient documentation

## 2024-02-04 DIAGNOSIS — O24419 Gestational diabetes mellitus in pregnancy, unspecified control: Secondary | ICD-10-CM

## 2024-02-04 DIAGNOSIS — J069 Acute upper respiratory infection, unspecified: Secondary | ICD-10-CM

## 2024-02-04 DIAGNOSIS — Z8759 Personal history of other complications of pregnancy, childbirth and the puerperium: Secondary | ICD-10-CM

## 2024-02-04 NOTE — Progress Notes (Signed)
 TELEHEALTH OBSTETRICS VISIT ENCOUNTER NOTE  Provider location: Center for [facility] at [facility]   Patient location: Home  I connected with Robin [name] on 02/04/24 at  1:30 PM EDT by telephone at home and verified that I am speaking with the correct person using two identifiers. Of note, unable to do video encounter due to technical difficulties.   I discussed the limitations, risks, security and privacy concerns of performing an evaluation and management service by telephone and the availability of in person appointments. I also discussed with the patient that there may be a patient responsible charge related to this service. The patient expressed understanding and agreed to proceed.  Subjective:  Robin [name] is a 33 y.o. [id number] at [redacted]w[redacted]d being followed for ongoing prenatal care.  She is currently monitored for the following issues for this high-risk pregnancy and has Obesity affecting pregnancy in third trimester, antepartum; BMI 60.0-69.9, adult (HCC); History of pre-eclampsia; Chronic hypertension; History of substance use disorder; Anxiety and depression; Late prenatal care affecting pregnancy in third trimester; Supervision of high risk pregnancy, antepartum; Drug use complicating pregnancy; Gestational diabetes mellitus (GDM) in third trimester; and Upper respiratory tract infection on their problem list.  Patient reports URI s/s so changed visit to virtual. +cough and congestion but no f/c/cp/sob. Kids sick at home.  Reports fetal movement. Denies any contractions, bleeding or leaking of fluid.   The following portions of the patient's history were reviewed and updated as appropriate: allergies, current medications, past family history, past medical history, past social history, past surgical history and problem list.   Objective:  Last menstrual period 05/29/2023. General:  Alert, oriented and cooperative.   Mental Status: Normal mood and affect perceived.  Normal judgment and thought content.  Rest of physical exam deferred due to type of encounter  Assessment and Plan:  Pregnancy: [id number] at [redacted]w[redacted]d 1. Chronic hypertension (Primary) Not on any medications this pregnancy. Patient states she has no family (all in [place]) and no friends, neighbors that can help her and relies on [place] transport only. Will ask clinic RN to see if they feel comfortable doing an NST/BPP on patient as soon as possible to try and get some sort of antenatal surveillance.   2. Supervision of high risk pregnancy, antepartum BTL papers last visit not valid but would be unable to do if she had a vaginal delivery anyways and would be difficult to do as interval. If needed a c/s for some reason, can talk to her about doing BTL at that time  3. Late prenatal care affecting pregnancy in third trimester Only one visit at 34wks on 5/22. Patient states she has not had any u/s anywhere either ED, pregnancy network, etc. She does state that her periods were qmonth and regular. Follow up dating after MFM scan next week  4. History of substance use disorder UDS ordered last visit but nothing run. Can defer to L&D for obtaining one based on patient's HPI  5. History of pre-eclampsia  6. BMI 60.0-69.9, adult (HCC)  7. Gestational diabetes mellitus (GDM) in third trimester, gestational diabetes method of control unspecified 1h GTT on 5/22 was 185 (CMP glucose 195) and patient states she had GDM last pregnancy. Based on her labs on 5/22, I told her I would call her GDM. She states CNM last visit sent in supplies for checking CBGS, which the patient did last pregnancy, but she states she has no way to get to the [facility] pharmacy to pick the supplies  up (see above). A1c last visit was 5.7  I told her to try the best she can to try and pick up the supplies and start AM fasting and 2 hour postprandial checks but I recommend 37/0 IOL. Her MFM anatomy/dating/growth u/s is on 6/12 at 9am so I set her  up for 6/12 AM IOL and L&D told that patient will go straight from MFM to L&D, via Medicaid transport, unless her u/s shows that her dates are not c/w 37wks. Pt amenable to plan. I told patient that she will need to obtain childcare for her children while she's at least in the hospital.   8. Upper respiratory tract infection, unspecified type MAU precautions given.  9. Difficult social situation See above. SW consult PP  Preterm labor symptoms and general obstetric precautions including but not limited to vaginal bleeding, contractions, leaking of fluid and fetal movement were reviewed in detail with the patient.  I discussed the assessment and treatment plan with the patient. The patient was provided an opportunity to ask questions and all were answered. The patient agreed with the plan and demonstrated an understanding of the instructions. The patient was advised to call back or seek an in-person office evaluation/go to MAU at [facility] for any urgent or concerning symptoms. Please refer to After Visit Summary for other counseling recommendations.   I provided 15 minutes of non-face-to-face time during this encounter.  No follow-ups on file.  Future Appointments  Date Time Provider Department Center  02/04/2024  2:45 PM [facility] GDM CLASS [facility] NDM  02/12/2024  9:00 AM [facility] PROVIDER 1 [facility]  02/12/2024  9:30 AM [facility] US6 [facility]    Raynell Caller, MD Center for [employer], [facility]

## 2024-02-04 NOTE — Telephone Encounter (Signed)
 Closing note

## 2024-02-06 ENCOUNTER — Encounter (HOSPITAL_COMMUNITY): Payer: Self-pay | Admitting: *Deleted

## 2024-02-06 ENCOUNTER — Telehealth (HOSPITAL_COMMUNITY): Payer: Self-pay | Admitting: *Deleted

## 2024-02-06 ENCOUNTER — Other Ambulatory Visit

## 2024-02-06 NOTE — Telephone Encounter (Signed)
 Preadmission screen

## 2024-02-09 ENCOUNTER — Other Ambulatory Visit

## 2024-02-10 ENCOUNTER — Ambulatory Visit: Admitting: *Deleted

## 2024-02-10 ENCOUNTER — Encounter: Payer: Self-pay | Admitting: *Deleted

## 2024-02-10 ENCOUNTER — Ambulatory Visit (INDEPENDENT_AMBULATORY_CARE_PROVIDER_SITE_OTHER): Payer: Self-pay

## 2024-02-10 ENCOUNTER — Ambulatory Visit (INDEPENDENT_AMBULATORY_CARE_PROVIDER_SITE_OTHER): Admitting: Family Medicine

## 2024-02-10 VITALS — BP 133/88 | HR 90 | Wt 315.0 lb

## 2024-02-10 DIAGNOSIS — Z3A36 36 weeks gestation of pregnancy: Secondary | ICD-10-CM

## 2024-02-10 DIAGNOSIS — I10 Essential (primary) hypertension: Secondary | ICD-10-CM

## 2024-02-10 DIAGNOSIS — O0990 Supervision of high risk pregnancy, unspecified, unspecified trimester: Secondary | ICD-10-CM

## 2024-02-10 DIAGNOSIS — O24419 Gestational diabetes mellitus in pregnancy, unspecified control: Secondary | ICD-10-CM

## 2024-02-10 DIAGNOSIS — Z87898 Personal history of other specified conditions: Secondary | ICD-10-CM

## 2024-02-10 DIAGNOSIS — O24410 Gestational diabetes mellitus in pregnancy, diet controlled: Secondary | ICD-10-CM

## 2024-02-10 DIAGNOSIS — O10013 Pre-existing essential hypertension complicating pregnancy, third trimester: Secondary | ICD-10-CM | POA: Diagnosis not present

## 2024-02-10 DIAGNOSIS — Z6841 Body Mass Index (BMI) 40.0 and over, adult: Secondary | ICD-10-CM

## 2024-02-10 DIAGNOSIS — O0933 Supervision of pregnancy with insufficient antenatal care, third trimester: Secondary | ICD-10-CM

## 2024-02-10 NOTE — Progress Notes (Signed)

## 2024-02-10 NOTE — Progress Notes (Signed)
 erroneous

## 2024-02-11 NOTE — Progress Notes (Signed)
 Patient seen and assessed by nursing staff.  Agree with documentation and plan.

## 2024-02-11 NOTE — Progress Notes (Signed)
   PRENATAL VISIT NOTE  Subjective:  Robin [name] is a 33 y.o. [id number] at [redacted]w[redacted]d being seen today for ongoing prenatal care.  She is currently monitored for the following issues for this high-risk pregnancy and has Obesity affecting pregnancy in third trimester, antepartum; BMI 60.0-69.9, adult (HCC); History of pre-eclampsia; Chronic hypertension; History of substance use disorder; Anxiety and depression; Late prenatal care affecting pregnancy in third trimester; Supervision of high risk pregnancy, antepartum; Drug use complicating pregnancy; Gestational diabetes mellitus (GDM) in third trimester; and Supervision of high risk pregnancy due to social problems, third trimester on their problem list.  Patient reports no complaints.   .  .   . Denies leaking of fluid.   The following portions of the patient's history were reviewed and updated as appropriate: allergies, current medications, past family history, past medical history, past social history, past surgical history and problem list.   Objective:    There were no vitals filed for this visit.  Fetal Status:           General: Alert, oriented and cooperative. Patient is in no acute distress.  Skin: Skin is warm and dry. No rash noted.   Cardiovascular: Normal heart rate noted  Respiratory: Normal respiratory effort, no problems with respiration noted  Abdomen: Soft, gravid, appropriate for gestational age.        Pelvic: Cervical exam performed in the presence of a chaperone        Extremities: Normal range of motion.     Mental Status: Normal mood and affect. Normal behavior. Normal judgment and thought content.  NST:  Baseline: 145 bpm, Variability: Good {> 6 bpm), Accelerations: Reactive, and Decelerations: Absent  Assessment and Plan:  Pregnancy: [id number] at [redacted]w[redacted]d 1. Supervision of high risk pregnancy, antepartum (Primary) Cultures today Declined cervical exam - Cervicovaginal ancillary only - Strep Gp B NAA  2. Chronic  hypertension Currently on no meds  3. Diet controlled gestational diabetes mellitus (GDM) in third trimester Has not picked up meter, but changed her diet. Normal testing today For IOL @ 37 weeks if dating is confirmed  4. Late prenatal care affecting pregnancy in third trimester For dating with MFM on 02/12/2024  5. BMI 60.0-69.9, adult (HCC)   6. [redacted] weeks gestation of pregnancy   Preterm labor symptoms and general obstetric precautions including but not limited to vaginal bleeding, contractions, leaking of fluid and fetal movement were reviewed in detail with the patient. Please refer to After Visit Summary for other counseling recommendations.   Return in 4 weeks (on 03/09/2024).  Future Appointments  Date Time Provider Department Center  02/12/2024  7:15 AM [facility] SCHED ROOM [facility]IN[facility] None  02/12/2024  9:00 AM [facility] PROVIDER 1 [facility]  02/12/2024  9:30 AM [facility] US6 [facility]    Granville Layer, MD

## 2024-02-12 ENCOUNTER — Encounter: Payer: Self-pay | Admitting: Family Medicine

## 2024-02-12 ENCOUNTER — Other Ambulatory Visit: Payer: Self-pay | Admitting: Certified Nurse Midwife

## 2024-02-12 ENCOUNTER — Ambulatory Visit: Attending: Obstetrics and Gynecology | Admitting: Obstetrics

## 2024-02-12 ENCOUNTER — Inpatient Hospital Stay (HOSPITAL_COMMUNITY): Attending: Obstetrics and Gynecology

## 2024-02-12 ENCOUNTER — Inpatient Hospital Stay (HOSPITAL_COMMUNITY): Admission: RE | Admit: 2024-02-12 | Source: Home / Self Care | Admitting: Obstetrics and Gynecology

## 2024-02-12 ENCOUNTER — Ambulatory Visit

## 2024-02-12 VITALS — BP 136/91 | HR 88

## 2024-02-12 DIAGNOSIS — O10013 Pre-existing essential hypertension complicating pregnancy, third trimester: Secondary | ICD-10-CM

## 2024-02-12 DIAGNOSIS — E669 Obesity, unspecified: Secondary | ICD-10-CM

## 2024-02-12 DIAGNOSIS — O0933 Supervision of pregnancy with insufficient antenatal care, third trimester: Secondary | ICD-10-CM | POA: Diagnosis present

## 2024-02-12 DIAGNOSIS — O0990 Supervision of high risk pregnancy, unspecified, unspecified trimester: Secondary | ICD-10-CM

## 2024-02-12 DIAGNOSIS — O09293 Supervision of pregnancy with other poor reproductive or obstetric history, third trimester: Secondary | ICD-10-CM

## 2024-02-12 DIAGNOSIS — Z363 Encounter for antenatal screening for malformations: Secondary | ICD-10-CM | POA: Insufficient documentation

## 2024-02-12 DIAGNOSIS — Z3A37 37 weeks gestation of pregnancy: Secondary | ICD-10-CM

## 2024-02-12 DIAGNOSIS — O99320 Drug use complicating pregnancy, unspecified trimester: Secondary | ICD-10-CM | POA: Diagnosis not present

## 2024-02-12 DIAGNOSIS — O99213 Obesity complicating pregnancy, third trimester: Secondary | ICD-10-CM | POA: Diagnosis not present

## 2024-02-12 DIAGNOSIS — O99323 Drug use complicating pregnancy, third trimester: Secondary | ICD-10-CM

## 2024-02-12 DIAGNOSIS — F1910 Other psychoactive substance abuse, uncomplicated: Secondary | ICD-10-CM | POA: Insufficient documentation

## 2024-02-12 DIAGNOSIS — O0930 Supervision of pregnancy with insufficient antenatal care, unspecified trimester: Secondary | ICD-10-CM

## 2024-02-12 DIAGNOSIS — O358XX0 Maternal care for other (suspected) fetal abnormality and damage, not applicable or unspecified: Secondary | ICD-10-CM

## 2024-02-12 DIAGNOSIS — Z8759 Personal history of other complications of pregnancy, childbirth and the puerperium: Secondary | ICD-10-CM

## 2024-02-12 DIAGNOSIS — O24410 Gestational diabetes mellitus in pregnancy, diet controlled: Secondary | ICD-10-CM | POA: Diagnosis not present

## 2024-02-12 LAB — CERVICOVAGINAL ANCILLARY ONLY
Chlamydia: NEGATIVE
Comment: NEGATIVE
Comment: NORMAL
Neisseria Gonorrhea: NEGATIVE

## 2024-02-12 LAB — STREP GP B NAA: Strep Gp B NAA: NEGATIVE

## 2024-02-12 NOTE — Progress Notes (Signed)
 MFM Consult Note  Robin [name] is currently at 37 weeks and 0 days.  She was seen due to maternal obesity with a BMI of 60, recently diagnosed diet-controlled gestational diabetes, chronic hypertension that is not treated with any medications, and history of maternal substance use.  She presented late for prenatal care.    Due to [name] underlying medical issues, she was scheduled for induction later today following [name] ultrasound exam.   The patient reports that [name] [facility] of March 04, 2024 was based on a certain LMP.    She has not had any screening tests for fetal aneuploidy drawn in [name].    On today's exam, the EFW of 5 pounds measures at the 2nd percentile for [name] indicating IUGR.  This is the first ultrasound that she has had in [name].  The patient was advised that it is uncertain if [name] dates are inaccurate or if the baby is truly growth restricted.  Doppler studies of the umbilical arteries performed today showed a normal S/D ratio of 2.96.  There were no signs of absent or reversed end-diastolic flow. The total AFI was 18.31 cm (within normal limits).  The fetus is in the vertex presentation.  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  The increased risk that the baby may have Down syndrome due to the AV canal defect noted today was discussed with the patient.  She was advised to have [name] baby tested after birth to determine if any chromosomal abnormalities are present.  We contacted Robin [name], the pediatric cardiologist at [facility].  She will see the patient for a fetal echocardiogram at 3 PM this afternoon and will make recommendations regarding the most optimal hospital for delivery (either at [facility] or at [facility]).  The patient was advised that we will await Dr. Jaqueline Mering recommendations and will set [name] up for delivery at the appropriate institution.  The patient stated that all of [name] questions were answered  today.  A total of 45 minutes was spent counseling and coordinating the care for this patient.  Greater than 50% of the time was spent in direct face-to-face contact.

## 2024-02-12 NOTE — Progress Notes (Unsigned)
 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

## 2024-02-13 ENCOUNTER — Ambulatory Visit: Payer: Self-pay | Admitting: Family Medicine

## 2024-02-13 DIAGNOSIS — O0990 Supervision of high risk pregnancy, unspecified, unspecified trimester: Secondary | ICD-10-CM

## 2024-02-13 NOTE — Discharge Summary (Signed)
 Obstetrics Postpartum Discharge Summary   Admit Date:  02/12/2024 Discharge Date:  02/15/24  Primary OB: [facility]     Admission Diagnoses:  33 y.o. at [redacted]w[redacted]d pregnant admitted for fetal cardiac anomaly  Pregnancy / Antepartum complications:    Anxiety and depression   Chronic hypertension   Drug use complicating pregnancy (HHS-HCC)   Gestational diabetes mellitus (GDM) in third trimester (HHS-HCC)   History of pre-eclampsia   History of substance use disorder   Late prenatal care affecting pregnancy in third trimester (HHS-HCC)   Obesity affecting pregnancy in third trimester, antepartum (HHS-HCC)   Fetal cardiac anomaly complicating pregnancy, antepartum, single gestation (HHS-HCC)   Trichomonas infection   Discharge Diagnoses: S/p Vaginal, Spontaneous on Feb 13, 2024 IP EBL 50 mL  Delivery Details   Maternal Gravity and Parity: [id number] Gestational Age at Delivery: [redacted]w[redacted]d Baby Name / Sex: Baby Girl  Oddis Manual / female Date/Time of Birth: 02/13/2024 / 9:51 PM Birth Weight: 2.49 kg (5 lb 7.8 oz) Apgars: 8          Delivery type: Vaginal, Spontaneous Lacerations:   Episiotomy: None  Perineal: None                       Placenta: Spontaneous / Intact Labor Complications:  None Anesthesia: Epidural                  Delivering clinician Attending on call Delivery Nurse Registered Nurse Charge Nurse Licensed Practical Nurse Chief Resident Teressa Morene Donnice Colan, Felicita Bonds, MD Marsa Melvena Ebb Mitzie JONELLE Delbert, Megan Hammons, Joen Norlander, [facility] Course:    Antepartum/Intrapartum: Patient presented to triage for elevated blood pressures. She was ultimately recommended for IOL given FGR <3 percent at [redacted] weeks EGA with normal dopplers and a complex cardiac anomaly. She progressed and delivered a viable female infant on 6/13.    Postpartum: Ms.Decandia was transitioned  to Postpartum service. She recovered postpartum with hospital course outlined below.  On day of discharge, she had met all postpartum goals.   Neonatal Course: [facility] Course by Problem:  A1GDM - 1hr 185, 3hr not done. - Fasting glucose POD1 wnl - Recommended follow-up with PCP in 3 months for either GTT or HgbA1c   Late prenatal delivery  Drug use complicating pregnancy - endorsed THC and cocaine use in pregnancy. Declined UDS. - CSW postpartum  [ ]  2 week mood check   Chronic hypertension  Hx of pre-eclampsia  - Not on any meds. Has used amlodipine  in the past. - Admission pre-eclampsia labs without severe derangements - Vitals mild range in labor. Repeat CBC, CMP ordered postpartum and wnl (note slight Cr bump) > currently mild range Bps - will send home with amlodipine  5mg  daily  - Continue to monitor for clinical s/s of pre-eclampsia. Consider starting patient on antihypertensive if has mild range blood pressures. - PCP referral before dc   Trichomonas On UA at admission - Started 500mg  Flagyl PO BID x 7 days   Health Maintenance / Postpartum:  ABO/RH: A Positive - Rhogam not indicated   VTE Risk: Low - Recommend mechanical prophylaxis until ambulation, no further VTE prophylaxis recommended., Rx Lovenox: Not indicated   Immunizations:   Jump to review immunizations <redacted file path>   Action  Rubella  : immune  TdaP: 01/23/2017 up to date  Influenza: 12/05/2015 up  to date    Feeding method: breastfeeding: Yes, Pumping: No (Mom wants to wait until she goes home to start. Educated reqarding the need to begin asap aid in milk production) Contraception plan at discharge: Nexplanon to be placed at [facility] visit  Pap: NILM per patient in 2021. Due for pap postpartum.  Discharge:  No opioid prescriptions indicated.   Disposition: Home   Follow-up appointment: Routine postpartum check in 4-6 weeks with [facility] Hypertensive patient follow up:  Enrolled in MyChart HTN Care Plan with [facility]  The delivery summary has been signed and the discharge summary has been refreshed before signing.   LONELL CHE, MD   02/13/2024 ----------------------------------------------------- No future appointments.   Medication List     ASK your doctor about these medications    famotidine  20 MG tablet Commonly known as: PEPCID    prenatal vitamin-iron-FA tablet Commonly known as: PRENATE PLUS

## 2024-02-17 NOTE — Anesthesia Postprocedure Evaluation (Signed)
 Discharge from Anesthesia Care  Patient: Robin [name]  Procedures performed: Labor Epidural  Anesthesia type: cse Vitals Value Taken Time  BP    Temp    Pulse    Resp    SpO2     *If vital value is absent from grid, please refer to corresponding flowsheet vitals data  Anesthesia Post Evaluation  Patient participation: complete - patient participated Regional recovery expected within 48 hours: partial - full recovery expected Level of consciousness: oriented x3 Pain management: adequate Airway patency: patent Respiratory status: spontaneous ventilation Cardiovascular status: hemodynamically stable Hydration status: WDL PONV: none    Recent Flowsheet Information: No vitals data found for the desired time range.
